# Patient Record
Sex: Female | Born: 1940 | Race: White | Hispanic: No | Marital: Married | Smoking: Former smoker
Health system: Southern US, Community
[De-identification: ages and names within clinical notes are randomized; demographics above are authoritative.]

## PROBLEM LIST (undated history)

## (undated) DIAGNOSIS — E7800 Pure hypercholesterolemia, unspecified: Secondary | ICD-10-CM

## (undated) DIAGNOSIS — I10 Essential (primary) hypertension: Secondary | ICD-10-CM

## (undated) DIAGNOSIS — E119 Type 2 diabetes mellitus without complications: Secondary | ICD-10-CM

## (undated) HISTORY — PX: ORTHOPEDIC SURGERY: SHX850

## (undated) HISTORY — PX: ABDOMINAL HYSTERECTOMY: SHX81

## (undated) HISTORY — PX: CHOLECYSTECTOMY: SHX55

---

## 2009-01-10 ENCOUNTER — Ambulatory Visit: Payer: Self-pay | Admitting: Internal Medicine

## 2010-06-22 ENCOUNTER — Ambulatory Visit: Payer: Self-pay | Admitting: Family Medicine

## 2012-03-05 ENCOUNTER — Ambulatory Visit: Payer: Self-pay | Admitting: Internal Medicine

## 2015-07-12 ENCOUNTER — Other Ambulatory Visit: Payer: Self-pay | Admitting: Family Medicine

## 2015-07-12 DIAGNOSIS — Z1231 Encounter for screening mammogram for malignant neoplasm of breast: Secondary | ICD-10-CM

## 2018-06-20 ENCOUNTER — Ambulatory Visit
Admission: EM | Admit: 2018-06-20 | Discharge: 2018-06-20 | Disposition: A | Discharge status: Home / Self Care | Payer: Medicare Other | Attending: Family Medicine | Admitting: Family Medicine

## 2018-06-20 ENCOUNTER — Other Ambulatory Visit: Payer: Self-pay

## 2018-06-20 DIAGNOSIS — I10 Essential (primary) hypertension: Secondary | ICD-10-CM | POA: Insufficient documentation

## 2018-06-20 DIAGNOSIS — E7800 Pure hypercholesterolemia, unspecified: Secondary | ICD-10-CM | POA: Insufficient documentation

## 2018-06-20 DIAGNOSIS — Z880 Allergy status to penicillin: Secondary | ICD-10-CM | POA: Diagnosis not present

## 2018-06-20 DIAGNOSIS — Z885 Allergy status to narcotic agent status: Secondary | ICD-10-CM | POA: Diagnosis not present

## 2018-06-20 DIAGNOSIS — E119 Type 2 diabetes mellitus without complications: Secondary | ICD-10-CM | POA: Diagnosis not present

## 2018-06-20 DIAGNOSIS — Z7984 Long term (current) use of oral hypoglycemic drugs: Secondary | ICD-10-CM | POA: Insufficient documentation

## 2018-06-20 DIAGNOSIS — R300 Dysuria: Secondary | ICD-10-CM | POA: Diagnosis present

## 2018-06-20 DIAGNOSIS — Z79899 Other long term (current) drug therapy: Secondary | ICD-10-CM | POA: Insufficient documentation

## 2018-06-20 DIAGNOSIS — Z87891 Personal history of nicotine dependence: Secondary | ICD-10-CM | POA: Diagnosis not present

## 2018-06-20 DIAGNOSIS — R109 Unspecified abdominal pain: Secondary | ICD-10-CM | POA: Diagnosis present

## 2018-06-20 DIAGNOSIS — N390 Urinary tract infection, site not specified: Secondary | ICD-10-CM | POA: Diagnosis not present

## 2018-06-20 HISTORY — DX: Type 2 diabetes mellitus without complications: E11.9

## 2018-06-20 HISTORY — DX: Essential (primary) hypertension: I10

## 2018-06-20 HISTORY — DX: Pure hypercholesterolemia, unspecified: E78.00

## 2018-06-20 LAB — URINALYSIS, COMPLETE (UACMP) WITH MICROSCOPIC
BILIRUBIN URINE: NEGATIVE
Glucose, UA: NEGATIVE mg/dL
Hgb urine dipstick: NEGATIVE
KETONES UR: NEGATIVE mg/dL
NITRITE: POSITIVE — AB
PH: 6 (ref 5.0–8.0)
Protein, ur: NEGATIVE mg/dL
RBC / HPF: NONE SEEN RBC/hpf (ref 0–5)
Specific Gravity, Urine: 1.015 (ref 1.005–1.030)
WBC, UA: >50 WBC/hpf (ref 0–5)

## 2018-06-20 MED ORDER — NITROFURANTOIN MONOHYD MACRO 100 MG PO CAPS: 100.0000 mg | ORAL_CAPSULE | Freq: Two times a day (BID) | ORAL | 0 refills | Status: DC

## 2018-06-20 NOTE — ED Triage Notes (Signed)
"  I have a UTI."  Flank pain and dysuria starting yesterday.

## 2018-06-20 NOTE — Discharge Instructions (Signed)
Drink  lots of water

## 2018-06-20 NOTE — ED Provider Notes (Signed)
[facility]    CSN: [id number] Arrival date & time: 06/20/18  0851     History   Chief Complaint Chief Complaint  Patient presents with  . Flank Pain  . Dysuria    HPI Dawn Mccann is a 77 y.o. female.   The history is provided by the patient.  Flank Pain  Pertinent negatives include no abdominal pain.  Dysuria  Pain quality:  Burning and aching Pain severity:  Mild Onset quality:  Sudden Duration:  2 days Timing:  Constant Progression:  Worsening Chronicity:  New Recent urinary tract infections: no   Relieved by:  None tried Ineffective treatments:  None tried Associated symptoms: no abdominal pain, no fever, no flank pain, no nausea, no vaginal discharge and no vomiting   Risk factors: no hx of pyelonephritis, no hx of urolithiasis, no kidney transplant, not pregnant, no recurrent urinary tract infections, no renal cysts, no renal disease, no single kidney and no urinary catheter     Past Medical History:  Diagnosis Date  . Diabetes mellitus without complication ([place])   . High cholesterol   . Hypertension     There are no active problems to display for this patient.   Past Surgical History:  Procedure Laterality Date  . ABDOMINAL HYSTERECTOMY    . CHOLECYSTECTOMY    . ORTHOPEDIC SURGERY      OB History   None      Home Medications    Prior to Admission medications   Medication Sig Start Date End Date Taking? Authorizing Provider  metFORMIN (GLUCOPHAGE-XR) 500 MG 24 hr tablet Take by mouth. 01/11/18 01/11/19 Yes [provider]  lisinopril-hydrochlorothiazide (PRINZIDE,ZESTORETIC) 20-12.5 MG tablet Take 1 tablet by mouth daily. 04/15/18   [provider]  nitrofurantoin, macrocrystal-monohydrate, (MACROBID) 100 MG capsule Take 1 capsule (100 mg total) by mouth 2 (two) times daily. 06/20/18   Norval Gable, MD  rosuvastatin (CRESTOR) 40 MG tablet Take 40 mg by mouth daily. 04/15/18   [provider]    Family  History History reviewed. No pertinent family history.  Social History Social History   Tobacco Use  . Smoking status: Former Research scientist (life sciences)  . Smokeless tobacco: Never Used  Substance Use Topics  . Alcohol use: Never    Frequency: Never  . Drug use: Never     Allergies   Amoxicillin and Codeine   Review of Systems Review of Systems  Constitutional: Negative for fever.  Gastrointestinal: Negative for abdominal pain, nausea and vomiting.  Genitourinary: Positive for dysuria. Negative for flank pain and vaginal discharge.     Physical Exam Triage Vital Signs ED Triage Vitals  Enc Vitals Group     BP 06/20/18 0902 (!) 158/73     Pulse Rate 06/20/18 0902 78     Resp 06/20/18 0902 18     Temp 06/20/18 0902 97.6 F (36.4 C)     Temp Source 06/20/18 0902 Oral     SpO2 06/20/18 0902 100 %     Weight 06/20/18 0903 188 lb (85.3 kg)     Height 06/20/18 0903 5\' 4"  (1.626 m)     Head Circumference --      Peak Flow --      Pain Score 06/20/18 0902 4     Pain Loc --      Pain Edu? --      Excl. in [place]? --    No data found.  Updated Vital Signs BP (!) 158/73 (BP Location: Right Arm)  Pulse 78   Temp 97.6 F (36.4 C) (Oral)   Resp 18   Ht 5\' 4"  (1.626 m)   Wt 188 lb (85.3 kg)   SpO2 100%   BMI 32.27 kg/m   Visual Acuity Right Eye Distance:   Left Eye Distance:   Bilateral Distance:    Right Eye Near:   Left Eye Near:    Bilateral Near:     Physical Exam  Constitutional: She appears well-developed and well-nourished. No distress.  Abdominal: Soft. Bowel sounds are normal. She exhibits no distension and no mass. There is no tenderness. There is no rebound and no guarding.  Skin: She is not diaphoretic.  Nursing note and vitals reviewed.    UC Treatments / Results  Labs (all labs ordered are listed, but only abnormal results are displayed) Labs Reviewed  URINALYSIS, COMPLETE (UACMP) WITH MICROSCOPIC - Abnormal; Notable for the following components:      Result  Value   APPearance CLOUDY (*)    Nitrite POSITIVE (*)    Leukocytes, UA LARGE (*)    Bacteria, UA MANY (*)    All other components within normal limits  URINE CULTURE    EKG None  Radiology No results found.  Procedures Procedures (including critical care time)  Medications Ordered in UC Medications - No data to display  Initial Impression / Assessment and Plan / UC Course  I have reviewed the triage vital signs and the nursing notes.  Pertinent labs & imaging results that were available during my care of the patient were reviewed by me and considered in my medical decision making (see chart for details).      Final Clinical Impressions(s) / UC Diagnoses   Final diagnoses:  Lower urinary tract infectious disease     Discharge Instructions     Drink lots of water    ED Prescriptions    Medication Sig Dispense Auth. Provider   nitrofurantoin, macrocrystal-monohydrate, (MACROBID) 100 MG capsule Take 1 capsule (100 mg total) by mouth 2 (two) times daily. 14 capsule Norval Gable, MD     1. Lab results and diagnosis reviewed with patient 2. rx as per orders above; reviewed possible side effects, interactions, risks and benefits  3. Recommend supportive treatment as above 4. Follow-up prn if symptoms worsen or don't improve  Controlled Substance Prescriptions [place] Controlled Substance Registry consulted? Not Applicable   Norval Gable, MD 06/20/18 [id number]

## 2018-06-23 LAB — URINE CULTURE
Culture: >=100000 — AB
Special Requests: NORMAL

## 2018-06-24 ENCOUNTER — Telehealth (HOSPITAL_COMMUNITY): Payer: Self-pay

## 2018-06-24 MED ORDER — SULFAMETHOXAZOLE-TRIMETHOPRIM 800-160 MG PO TABS: 1.0000 | ORAL_TABLET | Freq: Two times a day (BID) | ORAL | 0 refills | Status: AC

## 2018-06-24 NOTE — Telephone Encounter (Signed)
Urine culture kebsiella pneumoniae. Macrobid given at urgent care will not treat.  Rx for Bactrim BID x 5 days sent to pharmacy of choice. Pt called and made aware.

## 2018-07-20 ENCOUNTER — Inpatient Hospital Stay: Payer: Medicare Other

## 2018-07-20 ENCOUNTER — Inpatient Hospital Stay: Payer: Medicare Other | Attending: Internal Medicine | Admitting: Internal Medicine

## 2018-07-20 VITALS — BP 111/66 | HR 74 | Temp 97.2°F | Resp 16 | Wt 187.0 lb

## 2018-07-20 DIAGNOSIS — R5383 Other fatigue: Secondary | ICD-10-CM | POA: Insufficient documentation

## 2018-07-20 DIAGNOSIS — E119 Type 2 diabetes mellitus without complications: Secondary | ICD-10-CM | POA: Diagnosis not present

## 2018-07-20 DIAGNOSIS — D509 Iron deficiency anemia, unspecified: Secondary | ICD-10-CM | POA: Insufficient documentation

## 2018-07-20 DIAGNOSIS — Z79899 Other long term (current) drug therapy: Secondary | ICD-10-CM | POA: Insufficient documentation

## 2018-07-20 DIAGNOSIS — Z87891 Personal history of nicotine dependence: Secondary | ICD-10-CM | POA: Diagnosis not present

## 2018-07-20 DIAGNOSIS — Z7984 Long term (current) use of oral hypoglycemic drugs: Secondary | ICD-10-CM | POA: Insufficient documentation

## 2018-07-20 DIAGNOSIS — I10 Essential (primary) hypertension: Secondary | ICD-10-CM | POA: Diagnosis not present

## 2018-07-20 DIAGNOSIS — D472 Monoclonal gammopathy: Secondary | ICD-10-CM | POA: Diagnosis not present

## 2018-07-20 DIAGNOSIS — R252 Cramp and spasm: Secondary | ICD-10-CM | POA: Diagnosis not present

## 2018-07-20 DIAGNOSIS — R531 Weakness: Secondary | ICD-10-CM | POA: Diagnosis not present

## 2018-07-20 DIAGNOSIS — E7800 Pure hypercholesterolemia, unspecified: Secondary | ICD-10-CM | POA: Diagnosis not present

## 2018-07-20 LAB — CBC WITH DIFFERENTIAL/PLATELET
Basophils Absolute: 0 10*3/uL (ref 0–0.1)
Basophils Relative: 0 %
EOS ABS: 0.1 10*3/uL (ref 0–0.7)
EOS PCT: 1 %
HCT: 32.4 % — ABNORMAL LOW (ref 35.0–47.0)
Hemoglobin: 11 g/dL — ABNORMAL LOW (ref 12.0–16.0)
LYMPHS ABS: 1.9 10*3/uL (ref 1.0–3.6)
Lymphocytes Relative: 31 %
MCH: 27.4 pg (ref 26.0–34.0)
MCHC: 34 g/dL (ref 32.0–36.0)
MCV: 80.7 fL (ref 80.0–100.0)
Monocytes Absolute: 0.3 10*3/uL (ref 0.2–0.9)
Monocytes Relative: 5 %
Neutro Abs: 3.8 10*3/uL (ref 1.4–6.5)
Neutrophils Relative %: 63 %
PLATELETS: 192 10*3/uL (ref 150–440)
RBC: 4.02 MIL/uL (ref 3.80–5.20)
RDW: 17.9 % — ABNORMAL HIGH (ref 11.5–14.5)
WBC: 6 10*3/uL (ref 3.6–11.0)

## 2018-07-20 LAB — HEPATIC FUNCTION PANEL
ALBUMIN: 4.6 g/dL (ref 3.5–5.0)
ALK PHOS: 64 U/L (ref 38–126)
ALT: 14 U/L (ref 0–44)
AST: 21 U/L (ref 15–41)
BILIRUBIN DIRECT: 0.1 mg/dL (ref 0.0–0.2)
BILIRUBIN TOTAL: 0.7 mg/dL (ref 0.3–1.2)
Indirect Bilirubin: 0.6 mg/dL (ref 0.3–0.9)
Total Protein: 7.4 g/dL (ref 6.5–8.1)

## 2018-07-20 LAB — RETICULOCYTES
RBC.: 3.93 MIL/uL (ref 3.80–5.20)
Retic Count, Absolute: 153.3 10*3/uL (ref 19.0–183.0)
Retic Ct Pct: 3.9 % — ABNORMAL HIGH (ref 0.4–3.1)

## 2018-07-20 LAB — LACTATE DEHYDROGENASE: LDH: 138 U/L (ref 98–192)

## 2018-07-20 LAB — C-REACTIVE PROTEIN: CRP: 1.4 mg/dL — AB (ref <1.0)

## 2018-07-20 NOTE — Assessment & Plan Note (Addendum)
#   Chronic mild anemia with recent worsening hemoglobin around 10; MCV slightly low at 76.  The etiology is unclear.  B12/folic acid-normal; Iron studies/PCP-not consistent with iron deficiency.  The etiology is unclear.  # Recommend work-up including CBC; LFTs myeloma work-up CRP.  Discussed with the patient that if above work-up are unrevealing-CT of the abdomen pelvis would be a reasonable.   Follow to be TBD based on above blood work.  Thank you Dr.Aldridge for allowing me to participate in the care of your pleasant patient. Please do not hesitate to contact me with questions or concerns in the interim.  # 45 minutes face-to-face with the patient discussing the above plan of care; more than 50% of time spent on prognosis/ natural history; counseling and coordination.

## 2018-07-20 NOTE — Progress Notes (Signed)
[name]  Patient Care Team: Gayland Curry, MD as PCP - General (Family Medicine)  CHIEF COMPLAINTS/PURPOSE OF CONSULTATION:  Anemia  # Anemia: [2016]- 10-11 Iron studies- NOT c/w IDA  # Sternal Biopsy [at 37 y]-bloodstream infection/status post hysterectomy.  Colonoscopy at [facility]   No history exists.     HISTORY OF PRESENTING ILLNESS:  Dawn Mccann 77 y.o.  female " lifelong history of anemia" as reported patient has been referred to [place] for further evaluation recommendations for anemia hemoglobin on 10.  Patient states that she has been anemic all her life; and has been fed on liver as a child.   Patient denies any blood in stools or black or stools.  Denies any blood in urine.  No vaginal bleeding.  Patient is not on p.o. iron because of constipation.  Patient does complain of intermittent cramping in the legs.  She has been on diet lost about 20 pounds in the last 12 months.  Intentional.  She does complain of significant fatigue.   Review of Systems  Constitutional: Positive for malaise/fatigue. Negative for chills, diaphoresis, fever and weight loss.  HENT: Negative for nosebleeds and sore throat.   Eyes: Negative for double vision.  Respiratory: Negative for cough, hemoptysis, sputum production, shortness of breath and wheezing.   Cardiovascular: Negative for chest pain, palpitations, orthopnea and leg swelling.  Gastrointestinal: Negative for abdominal pain, blood in stool, constipation, diarrhea, heartburn, melena, nausea and vomiting.  Genitourinary: Negative for dysuria, frequency and urgency.  Musculoskeletal: Negative for back pain and joint pain.  Skin: Negative.  Negative for itching and rash.  Neurological: Negative for dizziness, tingling, focal weakness, weakness and headaches.  Endo/Heme/Allergies: Does not bruise/bleed easily.  Psychiatric/Behavioral: Negative for depression. The patient is not nervous/anxious and does  not have insomnia.      MEDICAL HISTORY:  Past Medical History:  Diagnosis Date  . Diabetes mellitus without complication ([place])   . High cholesterol   . Hypertension     SURGICAL HISTORY: Past Surgical History:  Procedure Laterality Date  . ABDOMINAL HYSTERECTOMY    . CHOLECYSTECTOMY    . ORTHOPEDIC SURGERY      SOCIAL HISTORY: retd. [employer]; quit smoking 10 years [30ppd]; no alcohol; lives in [place] History   Socioeconomic History  . Marital status: Married    Spouse name: Not on file  . Number of children: Not on file  . Years of education: Not on file  . Highest education level: Not on file  Occupational History  . Not on file  Social Needs  . Financial resource strain: Not on file  . Food insecurity:    Worry: Not on file    Inability: Not on file  . Transportation needs:    Medical: Not on file    Non-medical: Not on file  Tobacco Use  . Smoking status: Former Research scientist (life sciences)  . Smokeless tobacco: Never Used  Substance and Sexual Activity  . Alcohol use: Never    Frequency: Never  . Drug use: Never  . Sexual activity: Not on file  Lifestyle  . Physical activity:    Days per week: Not on file    Minutes per session: Not on file  . Stress: Not on file  Relationships  . Social connections:    Talks on phone: Not on file    Gets together: Not on file    Attends religious service: Not on file    Active member of club or organization:  Not on file    Attends meetings of clubs or organizations: Not on file    Relationship status: Not on file  . Intimate partner violence:    Fear of current or ex partner: Not on file    Emotionally abused: Not on file    Physically abused: Not on file    Forced sexual activity: Not on file  Other Topics Concern  . Not on file  Social History Narrative  . Not on file    FAMILY HISTORY: No family history on file.  ALLERGIES:  is allergic to amoxicillin and codeine.  MEDICATIONS:  Current Outpatient Medications   Medication Sig Dispense Refill  . lisinopril-hydrochlorothiazide (PRINZIDE,ZESTORETIC) 20-12.5 MG tablet Take 1 tablet by mouth daily.  3  . metFORMIN (GLUCOPHAGE-XR) 500 MG 24 hr tablet Take by mouth.    . rosuvastatin (CRESTOR) 40 MG tablet Take 40 mg by mouth daily.  3   No current facility-administered medications for this visit.       Marland Kitchen  PHYSICAL EXAMINATION: ECOG PERFORMANCE STATUS: 1 - Symptomatic but completely ambulatory  Vitals:   07/20/18 1403  BP: 111/66  Pulse: 74  Resp: 16  Temp: (!) 97.2 F (36.2 C)   Filed Weights   07/20/18 1401  Weight: 187 lb (84.8 kg)    GENERAL: Well-nourished well-developed; Alert, no distress and comfortable.  She is alone. EYES: no pallor or icterus OROPHARYNX: no thrush or ulceration; NECK: supple; no lymph nodes felt. LYMPH:  no palpable lymphadenopathy in the axillary or inguinal regions LUNGS: Decreased breath sounds auscultation bilaterally. No wheeze or crackles HEART/CVS: regular rate & rhythm and no murmurs; No lower extremity edema ABDOMEN:abdomen soft, non-tender and normal bowel sounds. No hepatomegaly or splenomegaly.  Musculoskeletal:no cyanosis of digits and no clubbing  PSYCH: alert & oriented x 3 with fluent speech NEURO: no focal motor/sensory deficits SKIN:  no rashes or significant lesions   LABORATORY DATA:  I have reviewed the data as listed Lab Results  Component Value Date   WBC 6.0 07/20/2018   HGB 11.0 (L) 07/20/2018   HCT 32.4 (L) 07/20/2018   MCV 80.7 07/20/2018   PLT 192 07/20/2018   Recent Labs    07/20/18 1451  PROT 7.4  ALBUMIN 4.6  AST 21  ALT 14  ALKPHOS 64  BILITOT 0.7  BILIDIR 0.1  IBILI 0.6    RADIOGRAPHIC STUDIES: I have personally reviewed the radiological images as listed and agreed with the findings in the report. No results found.  ASSESSMENT & PLAN:   Microcytic anemia # Chronic mild anemia with recent worsening hemoglobin around 10; MCV slightly low at 76.  The  etiology is unclear.  B12/folic acid-normal; Iron studies/PCP-not consistent with iron deficiency.  The etiology is unclear.  # Recommend work-up including CBC; LFTs myeloma work-up CRP.  Discussed with the patient that if above work-up are unrevealing-CT of the abdomen pelvis would be a reasonable.   Follow to be TBD based on above blood work.  Thank you Dr.Aldridge for allowing me to participate in the care of your pleasant patient. Please do not hesitate to contact me with questions or concerns in the interim.  # 45 minutes face-to-face with the patient discussing the above plan of care; more than 50% of time spent on prognosis/ natural history; counseling and coordination.    All questions were answered. The patient knows to call the clinic with any problems, questions or concerns.     Cammie Sickle, MD 07/22/2018 5:32  PM

## 2018-07-21 LAB — KAPPA/LAMBDA LIGHT CHAINS
Kappa free light chain: 27.7 mg/L — ABNORMAL HIGH (ref 3.3–19.4)
Kappa, lambda light chain ratio: 1 (ref 0.26–1.65)
Lambda free light chains: 27.6 mg/L — ABNORMAL HIGH (ref 5.7–26.3)

## 2018-07-22 ENCOUNTER — Telehealth: Payer: Self-pay | Admitting: Internal Medicine

## 2018-07-22 LAB — MULTIPLE MYELOMA PANEL, SERUM
ALBUMIN SERPL ELPH-MCNC: 4 g/dL (ref 2.9–4.4)
Albumin/Glob SerPl: 1.3 (ref 0.7–1.7)
Alpha 1: 0.3 g/dL (ref 0.0–0.4)
Alpha2 Glob SerPl Elph-Mcnc: 0.7 g/dL (ref 0.4–1.0)
B-Globulin SerPl Elph-Mcnc: 1.1 g/dL (ref 0.7–1.3)
Gamma Glob SerPl Elph-Mcnc: 1.1 g/dL (ref 0.4–1.8)
Globulin, Total: 3.1 g/dL (ref 2.2–3.9)
IGM (IMMUNOGLOBULIN M), SRM: 303 mg/dL — AB (ref 26–217)
IgA: 100 mg/dL (ref 64–422)
IgG (Immunoglobin G), Serum: 918 mg/dL (ref 700–1600)
M Protein SerPl Elph-Mcnc: 0.4 g/dL — ABNORMAL HIGH
Total Protein ELP: 7.1 g/dL (ref 6.0–8.5)

## 2018-07-22 NOTE — Telephone Encounter (Signed)
Dawn Mccann/Brooke-please inform patient that her hemoglobin is stable at 11; however I would like to speak to her regarding the results of the blood work/next plan in person.  I would recommend follow-up with me next week August 13 in [place]; no labs.  Thank you

## 2018-07-23 NOTE — Telephone Encounter (Signed)
I left message for patient to return phone call.   

## 2018-07-26 NOTE — Telephone Encounter (Signed)
I have contacted patient and notified her of these lab results and Dr. Sharmaine Base recommendations.  Shirlean Mylar, would you mind scheduling patient with Dr. B at 11:30 tomorrow? Thanks!

## 2018-07-27 ENCOUNTER — Encounter: Payer: Self-pay | Admitting: Internal Medicine

## 2018-07-27 ENCOUNTER — Inpatient Hospital Stay (HOSPITAL_BASED_OUTPATIENT_CLINIC_OR_DEPARTMENT_OTHER): Payer: Medicare Other | Admitting: Internal Medicine

## 2018-07-27 VITALS — BP 138/74 | HR 86 | Temp 97.1°F | Resp 16 | Wt 186.0 lb

## 2018-07-27 DIAGNOSIS — E7800 Pure hypercholesterolemia, unspecified: Secondary | ICD-10-CM

## 2018-07-27 DIAGNOSIS — D509 Iron deficiency anemia, unspecified: Secondary | ICD-10-CM

## 2018-07-27 DIAGNOSIS — I10 Essential (primary) hypertension: Secondary | ICD-10-CM

## 2018-07-27 DIAGNOSIS — R252 Cramp and spasm: Secondary | ICD-10-CM

## 2018-07-27 DIAGNOSIS — R531 Weakness: Secondary | ICD-10-CM

## 2018-07-27 DIAGNOSIS — D472 Monoclonal gammopathy: Secondary | ICD-10-CM

## 2018-07-27 DIAGNOSIS — R5383 Other fatigue: Secondary | ICD-10-CM | POA: Diagnosis not present

## 2018-07-27 DIAGNOSIS — Z7984 Long term (current) use of oral hypoglycemic drugs: Secondary | ICD-10-CM

## 2018-07-27 DIAGNOSIS — Z79899 Other long term (current) drug therapy: Secondary | ICD-10-CM

## 2018-07-27 DIAGNOSIS — Z87891 Personal history of nicotine dependence: Secondary | ICD-10-CM

## 2018-07-27 DIAGNOSIS — E119 Type 2 diabetes mellitus without complications: Secondary | ICD-10-CM

## 2018-07-27 NOTE — Assessment & Plan Note (Addendum)
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  Normal kappa lambda light chain ratio.  New.   # I had a long discussion the patient regarding natural history and biology of MGUS; in general IgM MGUS is categorized in the high risk.  However at this time surveillance is recommended.  I would not recommend a bone marrow biopsy at this time.  # Chronic mild anemia with recent worsening hemoglobin around 11-recent UTI/CRP- 1.4.   # follow up in 6 months/cbcbmp/myeloma panel/K-L light chains- 1 week- Dr.B  # 25 minutes face-to-face with the patient discussing the above plan of care; more than 50% of time spent on prognosis/ natural history; counseling and coordination.

## 2018-07-27 NOTE — Progress Notes (Signed)
[place] NOTE  Patient Care Team: Gayland Curry, MD as PCP - General (Family Medicine)  CHIEF COMPLAINTS/PURPOSE OF CONSULTATION:  Anemia  # AUGUST 2019-  MGUS-IgM Kappa 0.4gm/dl N=K/l light chain ratio.  # Anemia: [2016]- 10-11 Iron studies- NOT c/w IDA  # Sternal Biopsy [at 37 y]-bloodstream infection/status post hysterectomy.  Colonoscopy at [facility]   No history exists.     HISTORY OF PRESENTING ILLNESS:  Dawn Mccann 77 y.o.  female is here to review the results of her work-up for anemia.  Patient continues to have mild to moderate fatigue.   Complains of intermittent cramping in the legs.  Otherwise no tingling or numbness.  No nausea no vomiting.  Appetite is good.  She has been on diet.   Review of Systems  Constitutional: Positive for malaise/fatigue. Negative for chills, diaphoresis, fever and weight loss.  HENT: Negative for nosebleeds and sore throat.   Eyes: Negative for double vision.  Respiratory: Negative for cough, hemoptysis, sputum production, shortness of breath and wheezing.   Cardiovascular: Negative for chest pain, palpitations, orthopnea and leg swelling.  Gastrointestinal: Negative for abdominal pain, blood in stool, constipation, diarrhea, heartburn, melena, nausea and vomiting.  Genitourinary: Negative for dysuria, frequency and urgency.  Musculoskeletal: Negative for back pain and joint pain.  Skin: Negative.  Negative for itching and rash.  Neurological: Negative for dizziness, tingling, focal weakness, weakness and headaches.  Endo/Heme/Allergies: Does not bruise/bleed easily.  Psychiatric/Behavioral: Negative for depression. The patient is not nervous/anxious and does not have insomnia.      MEDICAL HISTORY:  Past Medical History:  Diagnosis Date  . Diabetes mellitus without complication ([place])   . High cholesterol   . Hypertension     SURGICAL HISTORY: Past Surgical History:  Procedure Laterality Date   . ABDOMINAL HYSTERECTOMY    . CHOLECYSTECTOMY    . ORTHOPEDIC SURGERY      SOCIAL HISTORY: retd. [employer]; quit smoking 10 years [30ppd]; no alcohol; lives in [place] History   Socioeconomic History  . Marital status: Married    Spouse name: Not on file  . Number of children: Not on file  . Years of education: Not on file  . Highest education level: Not on file  Occupational History  . Not on file  Social Needs  . Financial resource strain: Not on file  . Food insecurity:    Worry: Not on file    Inability: Not on file  . Transportation needs:    Medical: Not on file    Non-medical: Not on file  Tobacco Use  . Smoking status: Former Research scientist (life sciences)  . Smokeless tobacco: Never Used  Substance and Sexual Activity  . Alcohol use: Never    Frequency: Never  . Drug use: Never  . Sexual activity: Not on file  Lifestyle  . Physical activity:    Days per week: Not on file    Minutes per session: Not on file  . Stress: Not on file  Relationships  . Social connections:    Talks on phone: Not on file    Gets together: Not on file    Attends religious service: Not on file    Active member of club or organization: Not on file    Attends meetings of clubs or organizations: Not on file    Relationship status: Not on file  . Intimate partner violence:    Fear of current or ex partner: Not on file    Emotionally abused: Not  on file    Physically abused: Not on file    Forced sexual activity: Not on file  Other Topics Concern  . Not on file  Social History Narrative  . Not on file    FAMILY HISTORY: History reviewed. No pertinent family history.  ALLERGIES:  is allergic to amoxicillin and codeine.  MEDICATIONS:  Current Outpatient Medications  Medication Sig Dispense Refill  . lisinopril-hydrochlorothiazide (PRINZIDE,ZESTORETIC) 20-12.5 MG tablet Take 1 tablet by mouth daily.  3  . metFORMIN (GLUCOPHAGE-XR) 500 MG 24 hr tablet Take by mouth.    . rosuvastatin (CRESTOR) 40 MG  tablet Take 40 mg by mouth daily.  3   No current facility-administered medications for this visit.       Marland Kitchen  PHYSICAL EXAMINATION: ECOG PERFORMANCE STATUS: 1 - Symptomatic but completely ambulatory  Vitals:   07/27/18 1113 07/27/18 1114  BP:  138/74  Pulse:  86  Resp: 16 16  Temp:  (!) 97.1 F (36.2 C)   Filed Weights   07/27/18 1113  Weight: 186 lb (84.4 kg)    GENERAL: Well-nourished well-developed; Alert, no distress and comfortable.  She is alone. EYES: no pallor or icterus OROPHARYNX: no thrush or ulceration; NECK: supple; no lymph nodes felt. LYMPH:  no palpable lymphadenopathy in the axillary or inguinal regions LUNGS: Decreased breath sounds auscultation bilaterally. No wheeze or crackles HEART/CVS: regular rate & rhythm and no murmurs; No lower extremity edema ABDOMEN:abdomen soft, non-tender and normal bowel sounds. No hepatomegaly or splenomegaly.  Musculoskeletal:no cyanosis of digits and no clubbing  PSYCH: alert & oriented x 3 with fluent speech NEURO: no focal motor/sensory deficits SKIN:  no rashes or significant lesions   LABORATORY DATA:  I have reviewed the data as listed Lab Results  Component Value Date   WBC 6.0 07/20/2018   HGB 11.0 (L) 07/20/2018   HCT 32.4 (L) 07/20/2018   MCV 80.7 07/20/2018   PLT 192 07/20/2018   Recent Labs    07/20/18 1451  PROT 7.4  ALBUMIN 4.6  AST 21  ALT 14  ALKPHOS 64  BILITOT 0.7  BILIDIR 0.1  IBILI 0.6    RADIOGRAPHIC STUDIES: I have personally reviewed the radiological images as listed and agreed with the findings in the report. No results found.  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  Normal kappa lambda light chain ratio.  New.   # I had a long discussion the patient regarding natural history and biology of MGUS; in general IgM MGUS is categorized in the high risk.  However at  this time surveillance is recommended.  I would not recommend a bone marrow biopsy at this time.  # Chronic mild anemia with recent worsening hemoglobin around 11-recent UTI/CRP- 1.4.   # follow up in 6 months/cbcbmp/myeloma panel/K-L light chains- 1 week- Dr.B  # 25 minutes face-to-face with the patient discussing the above plan of care; more than 50% of time spent on prognosis/ natural history; counseling and coordination.   All questions were answered. The patient knows to call the clinic with any problems, questions or concerns.     Cammie Sickle, MD 08/03/2018 6:19 PM

## 2019-01-14 ENCOUNTER — Other Ambulatory Visit: Payer: Self-pay

## 2019-01-14 ENCOUNTER — Other Ambulatory Visit: Payer: Self-pay | Admitting: *Deleted

## 2019-01-14 DIAGNOSIS — D472 Monoclonal gammopathy: Secondary | ICD-10-CM

## 2019-01-19 ENCOUNTER — Inpatient Hospital Stay: Payer: Medicare Other | Attending: Hematology and Oncology

## 2019-01-19 DIAGNOSIS — Z79899 Other long term (current) drug therapy: Secondary | ICD-10-CM | POA: Diagnosis not present

## 2019-01-19 DIAGNOSIS — D509 Iron deficiency anemia, unspecified: Secondary | ICD-10-CM | POA: Insufficient documentation

## 2019-01-19 DIAGNOSIS — D472 Monoclonal gammopathy: Secondary | ICD-10-CM | POA: Diagnosis not present

## 2019-01-19 LAB — COMPREHENSIVE METABOLIC PANEL
ALBUMIN: 4.2 g/dL (ref 3.5–5.0)
ALT: 15 U/L (ref 0–44)
ANION GAP: 8 (ref 5–15)
AST: 17 U/L (ref 15–41)
Alkaline Phosphatase: 62 U/L (ref 38–126)
BILIRUBIN TOTAL: 0.8 mg/dL (ref 0.3–1.2)
BUN: 13 mg/dL (ref 8–23)
CHLORIDE: 106 mmol/L (ref 98–111)
CO2: 23 mmol/L (ref 22–32)
Calcium: 9.2 mg/dL (ref 8.9–10.3)
Creatinine, Ser: 0.75 mg/dL (ref 0.44–1.00)
GFR calc Af Amer: >60 mL/min (ref >60)
GFR calc non Af Amer: >60 mL/min (ref >60)
GLUCOSE: 139 mg/dL — AB (ref 70–99)
POTASSIUM: 4.2 mmol/L (ref 3.5–5.1)
SODIUM: 137 mmol/L (ref 135–145)
TOTAL PROTEIN: 7.2 g/dL (ref 6.5–8.1)

## 2019-01-19 LAB — CBC WITH DIFFERENTIAL/PLATELET
ABS IMMATURE GRANULOCYTES: 0.01 10*3/uL (ref 0.00–0.07)
BASOS PCT: 0 %
Basophils Absolute: 0 10*3/uL (ref 0.0–0.1)
Eosinophils Absolute: 0.1 10*3/uL (ref 0.0–0.5)
Eosinophils Relative: 2 %
HCT: 34.5 % — ABNORMAL LOW (ref 36.0–46.0)
HEMOGLOBIN: 11.5 g/dL — AB (ref 12.0–15.0)
IMMATURE GRANULOCYTES: 0 %
LYMPHS PCT: 34 %
Lymphs Abs: 1.6 10*3/uL (ref 0.7–4.0)
MCH: 27.3 pg (ref 26.0–34.0)
MCHC: 33.3 g/dL (ref 30.0–36.0)
MCV: 81.9 fL (ref 80.0–100.0)
MONO ABS: 0.3 10*3/uL (ref 0.1–1.0)
MONOS PCT: 7 %
NEUTROS ABS: 2.7 10*3/uL (ref 1.7–7.7)
NEUTROS PCT: 57 %
PLATELETS: 150 10*3/uL (ref 150–400)
RBC: 4.21 MIL/uL (ref 3.87–5.11)
RDW: 14.9 % (ref 11.5–15.5)
WBC: 4.8 10*3/uL (ref 4.0–10.5)
nRBC: 0 % (ref 0.0–0.2)

## 2019-01-20 LAB — KAPPA/LAMBDA LIGHT CHAINS
KAPPA, LAMDA LIGHT CHAIN RATIO: 1.1 (ref 0.26–1.65)
Kappa free light chain: 26.9 mg/L — ABNORMAL HIGH (ref 3.3–19.4)
Lambda free light chains: 24.5 mg/L (ref 5.7–26.3)

## 2019-01-22 LAB — MULTIPLE MYELOMA PANEL, SERUM
ALBUMIN SERPL ELPH-MCNC: 3.9 g/dL (ref 2.9–4.4)
ALPHA 1: 0.3 g/dL (ref 0.0–0.4)
ALPHA2 GLOB SERPL ELPH-MCNC: 0.6 g/dL (ref 0.4–1.0)
Albumin/Glob SerPl: 1.7 (ref 0.7–1.7)
B-Globulin SerPl Elph-Mcnc: 0.7 g/dL (ref 0.7–1.3)
Gamma Glob SerPl Elph-Mcnc: 0.8 g/dL (ref 0.4–1.8)
Globulin, Total: 2.4 g/dL (ref 2.2–3.9)
IGA: 87 mg/dL (ref 64–422)
IGG (IMMUNOGLOBIN G), SERUM: 785 mg/dL (ref 700–1600)
IGM (IMMUNOGLOBULIN M), SRM: 296 mg/dL — AB (ref 26–217)
M Protein SerPl Elph-Mcnc: 0.3 g/dL — ABNORMAL HIGH
Total Protein ELP: 6.3 g/dL (ref 6.0–8.5)

## 2019-01-25 ENCOUNTER — Inpatient Hospital Stay (HOSPITAL_BASED_OUTPATIENT_CLINIC_OR_DEPARTMENT_OTHER): Payer: Medicare Other | Admitting: Hematology and Oncology

## 2019-01-25 ENCOUNTER — Ambulatory Visit: Payer: Medicare Other | Admitting: Internal Medicine

## 2019-01-25 ENCOUNTER — Encounter: Payer: Self-pay | Admitting: Hematology and Oncology

## 2019-01-25 VITALS — BP 143/80 | HR 75 | Temp 98.7°F | Resp 18 | Ht 64.0 in | Wt 189.0 lb

## 2019-01-25 DIAGNOSIS — Z79899 Other long term (current) drug therapy: Secondary | ICD-10-CM | POA: Diagnosis not present

## 2019-01-25 DIAGNOSIS — D509 Iron deficiency anemia, unspecified: Secondary | ICD-10-CM | POA: Diagnosis present

## 2019-01-25 DIAGNOSIS — D649 Anemia, unspecified: Secondary | ICD-10-CM | POA: Insufficient documentation

## 2019-01-25 DIAGNOSIS — D472 Monoclonal gammopathy: Secondary | ICD-10-CM

## 2019-01-25 NOTE — Progress Notes (Signed)
No new changes noted today 

## 2019-01-25 NOTE — Patient Instructions (Signed)
Monoclonal Gammopathy of Undetermined Significance (MGUS)  Monoclonal gammopathy of undetermined significance (MGUS) is a condition in which there is too much of a protein called monoclonal protein, or M protein, in the blood. MGUS can cause you to have too many cells in your blood and not enough space for healthy cells. This condition may increase your risk of developing multiple myeloma or other blood disorders in the future.  What are the causes?  The cause of this condition is not known.  What increases the risk?  You are more likely to develop this condition if:   You are African American.   You are age 50 or older.   You are female.   You have an autoimmune disease.   You have been exposed to radiation.   You have a family history of MGUS.  What are the signs or symptoms?  There are no symptoms of this condition.  How is this diagnosed?    This condition may be diagnosed with a blood test that checks for M protein.  How is this treated?  Treatment for this condition may involve:   Having regular exams. This will allow your health care provider to monitor your health.   Having tests done regularly, such as:  ? Blood tests to check for M protein in your body.  ? Imaging tests, such as a CT scan.  ? A bone marrow biopsy. This test involves taking a sample of bone marrow from your body so it can be looked at under a microscope.  Follow these instructions at home:   Keep all follow-up visits as told by your health care provider. This is important.  Contact a health care provider if:   You have trouble swallowing.   You have pain in your back or ribs.   You have a fever.   You are bruising easily.  Get help right away if:   You break a bone.   You have trouble breathing.  Summary   Monoclonal gammopathy of undetermined significance (MGUS) is a condition in which there is too much of a protein called monoclonal protein, or M protein, in the blood.   This condition may be diagnosed with a blood test  that checks for M protein.   Treatment for this condition may involve having tests done regularly. Tests may include blood tests, imaging tests, and a bone marrow biopsy.  This information is not intended to replace advice given to you by your health care provider. Make sure you discuss any questions you have with your health care provider.  Document Released: 10/22/2016 Document Revised: 10/22/2016 Document Reviewed: 10/22/2016  [employer]vier Interactive Patient Education  2019 [employer]

## 2019-01-25 NOTE — Progress Notes (Signed)
[facility] day:  01/25/2019  Chief Complaint: Dawn Mccann is a 78 y.o. female with an IgM monoclonal gammopathy of unknown significance who is seen for new patient assessment.  HPI:  The patient was initially seen by Dr. Tish Men on 07/20/2018 for anemia.  Hemoglobin was 10.  She noted a lifelong history of anemia.  She describes being fed liver as a child.  She denied any blood in stools or black or stools.  She denies any hematuria or vaginal bleeding.  She was not on oral iron because of constipation.  On initial assessment, she noted intermittent leg cramping.  She was on a diet and had lost about 20 pounds intentionally in12 months.  She had significant fatigue.  Labs on 07/08/2018 revealed a ferritin 178, iron saturation of 14%, and a TIBC 348.  B12 was 324 and folate 12.2.  TSH was 1.44 on 12/28/2014.  Labs on 07/20/2018 revealed a hematocrit of 32.4, hemoglobin 11.0, MCV 80.7, platelets 192,000, WBC 6000 with an ANC of 3800.  Retic was 3.9%.  SPEP revealed a 0.4 gm/dL IgM monoclonal protein with kappa light chain specificity.  IgM was 303 (26-217).  Kappa free light chains were 27.7, lambda free light chains were 27.6 with a ratio of 1.0 (normal).  LFTs were normal.  CRP was 1.4.  LDH was 138.  The patient was last seen in the hematology clinic by Dr. Rogue Bussing on 07/27/2018.  At that time, she noted mild to moderate fatigue.   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  Surveillance was recommended.  Bone marrow biopsy was not recommended. Follow up in 6 months was recommended.  Labs on 01/19/2019 revealed a hematocrit of 34.5, hemoglobin 11.5, MCV 81.9, platelets 150,000, WBC 4800 with an ANC of 2700.  M-spike was 0.3 gm/dL.  IgM was 296.  Kappa free light chains were 26.9, lambda free light chains were 24.5 with a ratio of 1.10 (normal).  Creatinine was 0.75.  LFTs were normal.  Symptomatically, she  is doing well overall.  Previously noted lower extremity cramping has resolved. She has not had leg cramping in over 2 to 3 months. Patient denies that she has experienced any B symptoms. She denies any interval infections. Patient denies bleeding; no hematochezia, melena, or gross hematuria.  Last colonoscopy was on 04/17/2017 at [facility].  She denies chest pain, shortness of breath, and palpitations.   Patient denies urinary symptoms other than frequency. Of note, patient drinks lots of black coffee, to which she attributes the frequency.   Patient advises that she maintains an adequate appetite. She is eating well. Weight today is 189 lb 0.7 oz (85.7 kg). Patient maintains a diet rich in iron. She indicates that she eats meat and green leafy vegetables on a consistent basis. She denies any ice pica or restless legs.    Father has a history of metastatic SCLC (diagnosed at age of 85). Sister had a history of recurrent breast cancer.   She denies pain in the clinic today.   Past Medical History:  Diagnosis Date  . Diabetes mellitus without complication ([place])   . High cholesterol   . Hypertension     Past Surgical History:  Procedure Laterality Date  . ABDOMINAL HYSTERECTOMY    . CHOLECYSTECTOMY    . ORTHOPEDIC SURGERY      History reviewed. No pertinent family history.  Social History:  reports that she has quit smoking. She  has never used smokeless tobacco. She reports that she does not drink alcohol or use drugs.  She does no drink alcohol. Former smoker; quit 2011. Patient is a Engineer, agricultural brat".  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  She lived in [place] as a child.  She lives in [place].  The patient is accompanied by her daughter today.  Allergies:  Allergies  Allergen Reactions  . Amoxicillin Swelling    And HIVES  . Codeine Other (See Comments)    Other Reaction: Mental status changes    Current Medications: Current  Outpatient Medications  Medication Sig Dispense Refill  . lisinopril-hydrochlorothiazide (PRINZIDE,ZESTORETIC) 20-12.5 MG tablet Take 1 tablet by mouth daily.  3  . metFORMIN (GLUCOPHAGE-XR) 500 MG 24 hr tablet Take 500 mg by mouth 4 (four) times daily.     . rosuvastatin (CRESTOR) 40 MG tablet Take 40 mg by mouth daily.  3   No current facility-administered medications for this visit.     Review of Systems:  GENERAL:  Feels good.  No fevers, sweats.  Intentional weight loss (20 pounds in 12 months). PERFORMANCE STATUS (ECOG):  0 HEENT:  Allergies (sneezing).  No visual changes, runny nose, sore throat, mouth sores or tenderness. Lungs: No shortness of breath or cough.  No hemoptysis. Cardiac:  No chest pain, palpitations, orthopnea, or PND. GI:  No nausea, vomiting, diarrhea, constipation, melena or hematochezia.  Last colonoscopy 04/2017. GU:  Urinary frequency from drinking coffee.  No urgency, frequency, dysuria, or hematuria. Musculoskeletal:  No back pain.  No joint pain.  No muscle tenderness. Extremities:  Lower extremity cramping, resolved. Skin:  No rashes or skin changes. Neuro:  No headache, numbness or weakness, balance or coordination issues. Endocrine:  Diabetes.  No thyroid issues, hot flashes or night sweats. Psych:  No mood changes, depression or anxiety. Pain:  No focal pain. Review of systems:  All other systems reviewed and found to be negative.  Physical Exam: Blood pressure (!) 143/80, pulse 75, temperature 98.7 F (37.1 C), temperature source Tympanic, resp. rate 18, height _0  (1.626 m), weight 189 lb 0.7 oz (85.7 kg), SpO2 100 %. GENERAL:  Well developed, well nourished, woman sitting comfortably in the exam room in no acute distress. MENTAL STATUS:  Alert and oriented to person, place and time. HEAD:  Dark hair.  Normocephalic, atraumatic, face symmetric, no Cushingoid features. EYES:  Brown eyes.  Pupils equal round and reactive to light and accomodation.   No conjunctivitis or scleral icterus. ENT:  Oropharynx clear without lesion.  Tongue normal. Mucous membranes moist.  RESPIRATORY:  Clear to auscultation without rales, wheezes or rhonchi. CARDIOVASCULAR:  Regular rate and rhythm without murmur, rub or gallop. ABDOMEN:  Soft, non-tender, with active bowel sounds, and no hepatosplenomegaly.  No masses. SKIN:  No rashes, ulcers or lesions. EXTREMITIES: No edema, no skin discoloration or tenderness.  No palpable cords. LYMPH NODES: No palpable cervical, supraclavicular, axillary or inguinal adenopathy  NEUROLOGICAL: Unremarkable. PSYCH:  Appropriate.   No visits with results within 3 Day(s) from this visit.  Latest known visit with results is:  Appointment on 01/19/2019  Component Date Value Ref Range Status  . Kappa free light chain 01/19/2019 26.9* 3.3 - 19.4 mg/L Final  . Lamda free light chains 01/19/2019 24.5  5.7 - 26.3 mg/L Final  . Kappa, lamda light chain ratio 01/19/2019 1.10  0.26 - 1.65 Final   Comment: (NOTE) Performed At: [facility]  [place], [place] [id number] Rush Farmer MD [id number]   . IgG (Immunoglobin G), Serum 01/19/2019 785  700 - 1,600 mg/dL Final  . IgA 01/19/2019 87  64 - 422 mg/dL Final  . IgM (Immunoglobulin M), Srm 01/19/2019 296* 26 - 217 mg/dL Final  . Total Protein ELP 01/19/2019 6.3  6.0 - 8.5 g/dL Corrected  . Albumin SerPl Elph-Mcnc 01/19/2019 3.9  2.9 - 4.4 g/dL Corrected  . Alpha 1 01/19/2019 0.3  0.0 - 0.4 g/dL Corrected  . Alpha2 Glob SerPl Elph-Mcnc 01/19/2019 0.6  0.4 - 1.0 g/dL Corrected  . B-Globulin SerPl Elph-Mcnc 01/19/2019 0.7  0.7 - 1.3 g/dL Corrected  . Gamma Glob SerPl Elph-Mcnc 01/19/2019 0.8  0.4 - 1.8 g/dL Corrected  . M Protein SerPl Elph-Mcnc 01/19/2019 0.3* Not Observed g/dL Corrected  . Globulin, Total 01/19/2019 2.4  2.2 - 3.9 g/dL Corrected  . Albumin/Glob SerPl 01/19/2019 1.7  0.7 - 1.7 Corrected  . IFE 1 01/19/2019 Comment   Corrected    Comment: (NOTE) Immunofixation shows IgM monoclonal protein with kappa light chain specificity.   . Please Note 01/19/2019 Comment   Corrected   Comment: (NOTE) Protein electrophoresis scan will follow via computer, mail, or courier delivery. Performed At: [facility] [place], [place] [id number] Rush Farmer MD [id number]   . Sodium 01/19/2019 137  135 - 145 mmol/L Final  . Potassium 01/19/2019 4.2  3.5 - 5.1 mmol/L Final  . Chloride 01/19/2019 106  98 - 111 mmol/L Final  . CO2 01/19/2019 23  22 - 32 mmol/L Final  . Glucose, Bld 01/19/2019 139* 70 - 99 mg/dL Final  . BUN 01/19/2019 13  8 - 23 mg/dL Final  . Creatinine, Ser 01/19/2019 0.75  0.44 - 1.00 mg/dL Final  . Calcium 01/19/2019 9.2  8.9 - 10.3 mg/dL Final  . Total Protein 01/19/2019 7.2  6.5 - 8.1 g/dL Final  . Albumin 01/19/2019 4.2  3.5 - 5.0 g/dL Final  . AST 01/19/2019 17  15 - 41 U/L Final  . ALT 01/19/2019 15  0 - 44 U/L Final  . Alkaline Phosphatase 01/19/2019 62  38 - 126 U/L Final  . Total Bilirubin 01/19/2019 0.8  0.3 - 1.2 mg/dL Final  . GFR calc non Af Amer 01/19/2019 >60  >60 mL/min Final  . GFR calc Af Amer 01/19/2019 >60  >60 mL/min Final  . Anion gap 01/19/2019 8  5 - 15 Final   Performed at [facility], [address], [place], [place] [address]  . WBC 01/19/2019 4.8  4.0 - 10.5 K/uL Final  . RBC 01/19/2019 4.21  3.87 - 5.11 MIL/uL Final  . Hemoglobin 01/19/2019 11.5* 12.0 - 15.0 g/dL Final  . HCT 01/19/2019 34.5* 36.0 - 46.0 % Final  . MCV 01/19/2019 81.9  80.0 - 100.0 fL Final  . MCH 01/19/2019 27.3  26.0 - 34.0 pg Final  . MCHC 01/19/2019 33.3  30.0 - 36.0 g/dL Final  . RDW 01/19/2019 14.9  11.5 - 15.5 % Final  . Platelets 01/19/2019 150  150 - 400 K/uL Final  . nRBC 01/19/2019 0.0  0.0 - 0.2 % Final  . Neutrophils Relative % 01/19/2019 57  % Final  . Neutro Abs 01/19/2019 2.7  1.7 - 7.7 K/uL Final  . Lymphocytes Relative 01/19/2019 34  % Final  . Lymphs  Abs 01/19/2019 1.6  0.7 - 4.0 K/uL Final  . Monocytes Relative 01/19/2019 7  % Final  .  Monocytes Absolute 01/19/2019 0.3  0.1 - 1.0 K/uL Final  . Eosinophils Relative 01/19/2019 2  % Final  . Eosinophils Absolute 01/19/2019 0.1  0.0 - 0.5 K/uL Final  . Basophils Relative 01/19/2019 0  % Final  . Basophils Absolute 01/19/2019 0.0  0.0 - 0.1 K/uL Final  . Immature Granulocytes 01/19/2019 0  % Final  . Abs Immature Granulocytes 01/19/2019 0.01  0.00 - 0.07 K/uL Final   Performed at [facility], [address], [place], [place] [address]    Assessment:  JAKITA DUTKIEWICZ is a 78 y.o. female with an IgM monoclonal gammopathy of unknown significance.  SPEP has been followed (gm/dL):  0.4 on 07/20/2018 and 0.3 on 01/19/2019.  IgM has been followed (26-217):  303 on 07/20/2018 and 296 on 01/19/2019.    She has a mild normocytic anemia.  Hemoglobin is 11.5.  Symptomatically, she denies any fever, sweats or weight loss.  Exam reveals no adenopathy or hepatosplenomegaly.  Plan: 1.   Review labs from 01/19/2019. 2.   IgM monoclonal gammopathy of unknown significance  Discuss entire medical history, diagnosis and management of an IgM monoclonal gammopathy.  Discuss risk of transformation to a lymphoproliferative disorder of 1%/year.   Discuss IgM multiple myeloma (extremely rare; < 1% of myeloma).    CRAB criteria:  hypercalcemia, renal insufficiency, anemia, bone lesions.   Discuss briefly Waldenstrom's macroglobulinemia.    organomegaly, bulky adenopathy, anemia/cytopenias related to disease, hyperviscosity, neuropathy.  Discuss low level of monoclonal IgM.  Discuss ongoing surveillance every 6 months. 3.   Mild normocytic anemia  Hematocrit 32.4 - 34.5 and hemoglobin 11.0 - 11.5 since 07/2018.  Discuss work-up at next lab draw.  Patient notes colonoscopy at [facility] in 04/2017.   She is scheduled for follow-up colonoscopy in 2 years secondary to polyps. 4.   RTC in 6 months for  MD assessment and labs (CBC with diff, CMP, SPEP, FLCA, B12, folate, ferritin, iron studies).   Honor Loh, NP  01/25/2019, 10:48 AM   I saw and evaluated the patient, participating in the key portions of the service and reviewing pertinent diagnostic studies and records.  I reviewed the nurse practitioner's note and agree with the findings and the plan.  The assessment and plan were discussed with the patient.  Multiple questions were asked by the patient and answered.   Nolon Stalls, MD 01/25/2019,10:48 AM

## 2019-07-19 ENCOUNTER — Other Ambulatory Visit: Payer: Medicare Other

## 2019-07-26 ENCOUNTER — Ambulatory Visit: Payer: Medicare Other | Admitting: Hematology and Oncology

## 2019-09-27 ENCOUNTER — Other Ambulatory Visit: Payer: Medicare Other

## 2019-10-04 ENCOUNTER — Ambulatory Visit: Payer: Medicare Other | Admitting: Hematology and Oncology

## 2020-04-09 ENCOUNTER — Telehealth: Payer: Self-pay | Admitting: *Deleted

## 2020-04-09 NOTE — Telephone Encounter (Signed)
Received referral for low dose lung cancer screening CT scan. Message left at phone number listed in EMR for patient to call me back to facilitate scheduling scan.  

## 2020-04-10 ENCOUNTER — Encounter: Payer: Self-pay | Admitting: *Deleted

## 2020-04-10 ENCOUNTER — Telehealth: Payer: Self-pay | Admitting: *Deleted

## 2020-04-10 DIAGNOSIS — Z87891 Personal history of nicotine dependence: Secondary | ICD-10-CM

## 2020-04-10 NOTE — Telephone Encounter (Signed)
Received referral for initial lung cancer screening scan. Contacted patient and obtained smoking history,(former, quit 2011, 33.75 pack year) as well as answering questions related to screening process. Patient denies signs of lung cancer such as weight loss or hemoptysis. Patient denies comorbidity that would prevent curative treatment if lung cancer were found. Patient is scheduled for shared decision making visit and CT scan on 04/17/20 at 145pm.

## 2020-04-17 ENCOUNTER — Ambulatory Visit
Admission: RE | Admit: 2020-04-17 | Discharge: 2020-04-17 | Disposition: A | Discharge status: Home / Self Care | Payer: Medicare Other | Source: Ambulatory Visit | Attending: Oncology | Admitting: Oncology

## 2020-04-17 ENCOUNTER — Inpatient Hospital Stay: Payer: Medicare Other | Attending: Oncology | Admitting: Hospice and Palliative Medicine

## 2020-04-17 ENCOUNTER — Other Ambulatory Visit: Payer: Self-pay

## 2020-04-17 DIAGNOSIS — Z87891 Personal history of nicotine dependence: Secondary | ICD-10-CM | POA: Diagnosis not present

## 2020-04-17 DIAGNOSIS — Z122 Encounter for screening for malignant neoplasm of respiratory organs: Secondary | ICD-10-CM | POA: Diagnosis not present

## 2020-04-17 NOTE — Progress Notes (Signed)
In accordance with CMS guidelines, patient has met eligibility criteria including age, absence of signs or symptoms of lung cancer.  Social History   Tobacco Use  . Smoking status: Former Smoker    Packs/day: 0.75    Years: 45.00    Pack years: 33.75    Types: Cigarettes    Quit date: 2011    Years since quitting: 10.3  . Smokeless tobacco: Never Used  Substance Use Topics  . Alcohol use: Never  . Drug use: Never      A shared decision-making session was conducted prior to the performance of CT scan. This includes one or more decision aids, includes benefits and harms of screening, follow-up diagnostic testing, over-diagnosis, false positive rate, and total radiation exposure.   Counseling on the importance of adherence to annual lung cancer LDCT screening, impact of co-morbidities, and ability or willingness to undergo diagnosis and treatment is imperative for compliance of the program.   Counseling on the importance of continued smoking cessation for former smokers; the importance of smoking cessation for current smokers, and information about tobacco cessation interventions have been given to patient including [place] [facility] and 1800 quit [place] programs.   Written order for lung cancer screening with LDCT has been given to the patient and any and all questions have been answered to the best of my abilities.    Yearly follow up will be coordinated by Shawn Perkins, Thoracic Navigator.  Time Total: 15 minutes  Visit consisted of counseling and education dealing with complex health screening. Greater than 50%  of this time was spent counseling and coordinating care related to the above assessment and plan.  Signed by: Joshua Borders, PhD, NP-C [phone] (Work Cell)  

## 2020-04-19 ENCOUNTER — Encounter: Payer: Self-pay | Admitting: *Deleted

## 2020-05-20 NOTE — Progress Notes (Signed)
[facility]  [address], [address] [place], [place] [address] Phone: [phone]  Fax: [phone]   Clinic Day:  05/21/2020  Referring physician: Gayland Curry, MD  Chief Complaint: Dawn Mccann is a 79 y.o. female with with an IgM monoclonal gammopathy of unknown significance who is seen for 16 month assessment.  HPI: The patient was last seen in the hematology clinic on 01/25/2019. At that time, she denied any fever, sweats or weight loss.  Exam revealed no adenopathy or hepatosplenomegaly.  SPEP revealed an M-spike of 0.3 gm/dL.  IgM was 296.  Surveillance continued.    She was lost to follow-up.  She was referred to the lung cancer screening program.  She has a 33.75 pack year smoking history.  She quit smoking in 2011.  Low dose chest CT on 04/17/2020 revealed lung-RADS 2 S.  She has multiple small pulmonary nodules throughout the lungs bilaterally (largest in the right middle lobe measuring 3.7 mm).  She has aortic atherosclerosis and a three-vessel coronary artery disease.  There was mild diffuse bronchial wall thickening with mild central lobar and paraseptal emphysema suggesting underlying COPD.  She has splenomegaly (15.2 x 6.9 cm).  The spleen was incompletely imaged.  She was seen by Orville Govern, NP at [facility] on 05/01/2020 for multiple thyroid nodules.  She was noted to have thyroid nodules found incidentally on a carotid ultrasound.  Ultrasound on 05/01/2020 revealed no suspicious nodules on the right side and an intermediate suspicious 2.8 cm left-sided nodule.  She underwent fine-needle aspirate on 05/01/2020.  Cytology was negative for malignancy.  There was abundant colloid consistent with a benign colloid nodule.  During the interim, she has been well. She has been spending the last month catching up on all of her missed appointments from COVID-19. She is fully COVID-19 vaccinated, and received her 2nd vaccine on 01/12/2020; she  denied difficulty with the vaccine.   She has not gotten her repeat colonoscopy yet. She notes that it is not at the top of her priority list right now, but that she will not forget about it or put it off for too long.    Past Medical History:  Diagnosis Date  . Diabetes mellitus without complication ([place])   . High cholesterol   . Hypertension     Past Surgical History:  Procedure Laterality Date  . ABDOMINAL HYSTERECTOMY    . CHOLECYSTECTOMY    . ORTHOPEDIC SURGERY      History reviewed. No pertinent family history.  Social History:  reports that she quit smoking about 10 years ago. Her smoking use included cigarettes. She has a 33.75 pack-year smoking history. She has never used smokeless tobacco. She reports that she does not drink alcohol or use drugs. She does not drink alcohol. Former smoker; quit 2011. Patient is a Engineer, agricultural brat".  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  She lived in [place] as a child.  She lives in [place]. The patient is accompanied by her daughter, Cammy Copa, via ipad today.  Allergies:  Allergies  Allergen Reactions  . Amoxicillin Swelling    And HIVES  . Codeine Other (See Comments)    Other Reaction: Mental status changes    Current Medications: Current Outpatient Medications  Medication Sig Dispense Refill  . aspirin 81 MG EC tablet Take by mouth.    . Cholecalciferol 10 MCG (400 UNIT) CAPS Take by mouth.    Marland Kitchen  hydroquinone 4 % cream APPLY TO FACE EVERY NIGHT    . lisinopril (ZESTRIL) 20 MG tablet Take by mouth.    Marland Kitchen lisinopril-hydrochlorothiazide (PRINZIDE,ZESTORETIC) 20-12.5 MG tablet Take 1 tablet by mouth daily.  3  . metFORMIN (GLUCOPHAGE-XR) 500 MG 24 hr tablet Take 500 mg by mouth 4 (four) times daily.     . Multiple Vitamin (MULTI-VITAMIN) tablet Take by mouth.    . rosuvastatin (CRESTOR) 40 MG tablet Take 40 mg by mouth daily.  3   No current facility-administered medications for  this visit.    Review of Systems  Constitutional: Negative.  Negative for chills, diaphoresis, fever, malaise/fatigue and weight loss.       Feels "fine".  HENT: Negative.  Negative for congestion and sore throat.   Eyes: Negative.  Negative for blurred vision and double vision.  Respiratory: Negative.  Negative for cough and shortness of breath.   Cardiovascular: Negative.  Negative for chest pain, palpitations and leg swelling.  Gastrointestinal: Negative.  Negative for constipation, diarrhea, heartburn, nausea and vomiting.  Genitourinary: Positive for frequency (from drinking coffee). Negative for urgency.  Musculoskeletal: Negative.  Negative for back pain, falls and myalgias.  Skin: Negative.  Negative for rash.  Neurological: Negative.  Negative for dizziness, sensory change, weakness and headaches.  Endo/Heme/Allergies: Positive for environmental allergies. Does not bruise/bleed easily.  Psychiatric/Behavioral: Negative.  Negative for depression. The patient is not nervous/anxious.   All other systems reviewed and are negative.  Performance status (ECOG): 0 - Asymptomatic  Vitals Blood pressure 130/84, pulse 85, temperature (!) 96.1 F (35.6 C), temperature source Tympanic, resp. rate 16, weight 195 lb 10.5 oz (88.7 kg), SpO2 98 %.   Physical Exam Constitutional:      General: She is not in acute distress.    Appearance: Normal appearance. She is well-developed and well-nourished.     Interventions: Face mask in place.  HENT:     Head: Normocephalic and atraumatic.     Mouth/Throat:     Mouth: Oropharynx is clear and moist and mucous membranes are normal. No oral lesions.  Eyes:     Extraocular Movements: EOM normal.     Conjunctiva/sclera: Conjunctivae normal.     Pupils: Pupils are equal, round, and reactive to light.     Comments: Brown eyes  Cardiovascular:     Rate and Rhythm: Normal rate and regular rhythm.     Heart sounds: Normal heart sounds. No murmur heard.   No friction rub. No gallop.   Pulmonary:     Effort: Pulmonary effort is normal.     Breath sounds: Normal breath sounds. No wheezing, rhonchi or rales.  Abdominal:     General: Bowel sounds are normal.     Palpations: Abdomen is soft. There is splenomegaly. There is no hepatosplenomegaly or mass.     Tenderness: There is no abdominal tenderness. There is no CVA tenderness.  Musculoskeletal:        General: No tenderness or edema. Normal range of motion.     Cervical back: Normal range of motion and neck supple.  Lymphadenopathy:     Cervical: No cervical adenopathy.     Upper Body:  No axillary adenopathy present.    Lower Body: No right inguinal adenopathy. No left inguinal adenopathy.  Skin:    General: Skin is warm, dry and intact.     Findings: No bruising, erythema, lesion or rash.  Neurological:     Mental Status: She is alert and oriented to person,  place, and time.  Psychiatric:        Mood and Affect: Mood and affect normal.        Behavior: Behavior normal.        Thought Content: Thought content normal.        Judgment: Judgment normal.    No visits with results within 3 Day(s) from this visit.  Latest known visit with results is:  Appointment on 01/19/2019  Component Date Value Ref Range Status  . Kappa free light chain 01/19/2019 26.9* 3.3 - 19.4 mg/L Final  . Lamda free light chains 01/19/2019 24.5  5.7 - 26.3 mg/L Final  . Kappa, lamda light chain ratio 01/19/2019 1.10  0.26 - 1.65 Final   Comment: (NOTE) Performed At: [facility] [place], [place] [id number] Rush Farmer MD [id number]   . IgG (Immunoglobin G), Serum 01/19/2019 785  700 - 1,600 mg/dL Final  . IgA 01/19/2019 87  64 - 422 mg/dL Final  . IgM (Immunoglobulin M), Srm 01/19/2019 296* 26 - 217 mg/dL Final  . Total Protein ELP 01/19/2019 6.3  6.0 - 8.5 g/dL Corrected  . Albumin SerPl Elph-Mcnc 01/19/2019 3.9  2.9 - 4.4 g/dL Corrected  . Alpha 1 01/19/2019 0.3  0.0 -  0.4 g/dL Corrected  . Alpha2 Glob SerPl Elph-Mcnc 01/19/2019 0.6  0.4 - 1.0 g/dL Corrected  . B-Globulin SerPl Elph-Mcnc 01/19/2019 0.7  0.7 - 1.3 g/dL Corrected  . Gamma Glob SerPl Elph-Mcnc 01/19/2019 0.8  0.4 - 1.8 g/dL Corrected  . M Protein SerPl Elph-Mcnc 01/19/2019 0.3* Not Observed g/dL Corrected  . Globulin, Total 01/19/2019 2.4  2.2 - 3.9 g/dL Corrected  . Albumin/Glob SerPl 01/19/2019 1.7  0.7 - 1.7 Corrected  . IFE 1 01/19/2019 Comment   Corrected   Comment: (NOTE) Immunofixation shows IgM monoclonal protein with kappa light chain specificity.   . Please Note 01/19/2019 Comment   Corrected   Comment: (NOTE) Protein electrophoresis scan will follow via computer, mail, or courier delivery. Performed At: [facility] [place], [place] [id number] Rush Farmer MD [id number]   . Sodium 01/19/2019 137  135 - 145 mmol/L Final  . Potassium 01/19/2019 4.2  3.5 - 5.1 mmol/L Final  . Chloride 01/19/2019 106  98 - 111 mmol/L Final  . CO2 01/19/2019 23  22 - 32 mmol/L Final  . Glucose, Bld 01/19/2019 139* 70 - 99 mg/dL Final  . BUN 01/19/2019 13  8 - 23 mg/dL Final  . Creatinine, Ser 01/19/2019 0.75  0.44 - 1.00 mg/dL Final  . Calcium 01/19/2019 9.2  8.9 - 10.3 mg/dL Final  . Total Protein 01/19/2019 7.2  6.5 - 8.1 g/dL Final  . Albumin 01/19/2019 4.2  3.5 - 5.0 g/dL Final  . AST 01/19/2019 17  15 - 41 U/L Final  . ALT 01/19/2019 15  0 - 44 U/L Final  . Alkaline Phosphatase 01/19/2019 62  38 - 126 U/L Final  . Total Bilirubin 01/19/2019 0.8  0.3 - 1.2 mg/dL Final  . GFR calc non Af Amer 01/19/2019 >60  >60 mL/min Final  . GFR calc Af Amer 01/19/2019 >60  >60 mL/min Final  . Anion gap 01/19/2019 8  5 - 15 Final   Performed at [facility], [address], [place], [place] [address]  . WBC 01/19/2019 4.8  4.0 - 10.5 K/uL Final  . RBC 01/19/2019 4.21  3.87 - 5.11 MIL/uL Final  . Hemoglobin 01/19/2019 11.5* 12.0 - 15.0 g/dL  Final  . HCT  01/19/2019 34.5* 36.0 - 46.0 % Final  . MCV 01/19/2019 81.9  80.0 - 100.0 fL Final  . MCH 01/19/2019 27.3  26.0 - 34.0 pg Final  . MCHC 01/19/2019 33.3  30.0 - 36.0 g/dL Final  . RDW 01/19/2019 14.9  11.5 - 15.5 % Final  . Platelets 01/19/2019 150  150 - 400 K/uL Final  . nRBC 01/19/2019 0.0  0.0 - 0.2 % Final  . Neutrophils Relative % 01/19/2019 57  % Final  . Neutro Abs 01/19/2019 2.7  1.7 - 7.7 K/uL Final  . Lymphocytes Relative 01/19/2019 34  % Final  . Lymphs Abs 01/19/2019 1.6  0.7 - 4.0 K/uL Final  . Monocytes Relative 01/19/2019 7  % Final  . Monocytes Absolute 01/19/2019 0.3  0.1 - 1.0 K/uL Final  . Eosinophils Relative 01/19/2019 2  % Final  . Eosinophils Absolute 01/19/2019 0.1  0.0 - 0.5 K/uL Final  . Basophils Relative 01/19/2019 0  % Final  . Basophils Absolute 01/19/2019 0.0  0.0 - 0.1 K/uL Final  . Immature Granulocytes 01/19/2019 0  % Final  . Abs Immature Granulocytes 01/19/2019 0.01  0.00 - 0.07 K/uL Final   Performed at [facility], [address] [place], [place] [address]       Assessment:  CORA BRIERLEY is a 79 y.o. female with an IgM monoclonal gammopathy of unknown significance.  SPEP has been followed (gm/dL):  0.4 on 07/20/2018 and 0.3 on 01/19/2019.  IgM has been followed (26-217):  303 on 07/20/2018 and 296 on 01/19/2019.    She has a mild normocytic anemia.  Hemoglobin is 11.5.  She has a 33.75 pack year smoking history. Low dose chest CT on 04/17/2020 revealed lung-RADS 2 S.  She has multiple small pulmonary nodules throughout the lungs bilaterally (largest3. 7 mm  in the right middle lobe).  She has splenomegaly (15.2 x 6.9 cm).  The spleen was incompletely imaged.  She has multiple thyroid nodules incidentally found on a carotid ultrasound.  Ultrasound on 05/01/2020 revealed no suspicious nodules on the right side and an intermediate suspicious 2.8 cm left-sided nodule.  Fine-needle aspirate on 05/01/2020 was negative for  malignancy.  There was abundant colloid consistent with a benign colloid nodule.   Symptomatically, she feels "fine".  She denies any B symptoms.  Exam reveals splenomegaly.  Plan: 1.   Labs today: CBC with diff, CMP, SPEP, FLCA, B12, folate, ferritin, iron studies. 2.   IgM monoclonal gammopathy of unknown significance  Symptomatically, she feels fine.  She denies any B symptoms.  Exam reveals splenomegaly suggestive of a posble lymphoproliferative disorder such as Waldenstrm's macroglobulinemia/lymphoplasmacytic lymphoma.  Discuss risk of transformation to a lymphoproliferative disorder of 1%/year.   Discuss IgM multiple myeloma (extremely rare; < 1% of myeloma).    CRAB criteria:  hypercalcemia, renal insufficiency, anemia, bone lesions.   Discuss Waldenstrom's macroglobulinemia.    B symptoms, organomegaly, bulky adenopathy, anemia/cytopenias related to disease, hyperviscosity, neuropathy.  IgM level was low at last check in 01/2019.  Discuss a new evaluation and consideration CT scans. 3.   Mild normocytic anemia  Hematocrit 35.0.  Hemoglobin 11.8.  MCV 79.4 today   Hematocrit 32.4 - 34.5 and hemoglobin 11.0 - 11.5 since 07/2018.  Patient notes colonoscopy at [facility] in 04/2017.  She was scheduled for colonoscopy in 2 years secondary to polyps.   She has not yet arranged for colonoscopy secondary to COVID-19. 4.   Splenomegaly  Exam reveals splenomegaly today.  Discuss importance of CT scans 5.   Schedule abdomen and pelvis CT on 05/24/2020. 6.   RTC next week for MD assessment, review of work-up and discussion regarding direction of therapy.  I discussed the assessment and treatment plan with the patient.  The patient was provided an opportunity to ask questions and all were answered.  The patient agreed with the plan and demonstrated an understanding of the instructions.  The patient was advised to call back if the symptoms worsen or if the condition fails to improve as  anticipated.  I provided 14 minutes (11:26 AM - 11:40 AM) of face-to-face time during this this encounter and > 50% was spent counseling as documented under my assessment and plan. An additional 10 minutes were spent reviewing her chart (Epic and Care Everywhere) including notes, labs, and imaging studies.    Lequita Asal, MD, PhD    05/21/2020, 11:40 AM  I, Jacqualyn Posey, am acting as Education administrator for [employer]. Mike Gip, MD, PhD.  I, Floy Angert C. Mike Gip, MD, have reviewed the above documentation for accuracy and completeness, and I agree with the above.

## 2020-05-21 ENCOUNTER — Inpatient Hospital Stay: Payer: Medicare Other

## 2020-05-21 ENCOUNTER — Other Ambulatory Visit: Payer: Self-pay

## 2020-05-21 ENCOUNTER — Inpatient Hospital Stay: Payer: Medicare Other | Attending: Hematology and Oncology | Admitting: Hematology and Oncology

## 2020-05-21 ENCOUNTER — Encounter: Payer: Self-pay | Admitting: Hematology and Oncology

## 2020-05-21 VITALS — BP 130/84 | HR 85 | Temp 96.1°F | Resp 16 | Wt 195.7 lb

## 2020-05-21 DIAGNOSIS — E119 Type 2 diabetes mellitus without complications: Secondary | ICD-10-CM | POA: Insufficient documentation

## 2020-05-21 DIAGNOSIS — D472 Monoclonal gammopathy: Secondary | ICD-10-CM | POA: Insufficient documentation

## 2020-05-21 DIAGNOSIS — I10 Essential (primary) hypertension: Secondary | ICD-10-CM | POA: Insufficient documentation

## 2020-05-21 DIAGNOSIS — E7800 Pure hypercholesterolemia, unspecified: Secondary | ICD-10-CM | POA: Diagnosis not present

## 2020-05-21 DIAGNOSIS — Z7984 Long term (current) use of oral hypoglycemic drugs: Secondary | ICD-10-CM | POA: Diagnosis not present

## 2020-05-21 DIAGNOSIS — R161 Splenomegaly, not elsewhere classified: Secondary | ICD-10-CM | POA: Diagnosis not present

## 2020-05-21 DIAGNOSIS — D649 Anemia, unspecified: Secondary | ICD-10-CM | POA: Insufficient documentation

## 2020-05-21 DIAGNOSIS — Z87891 Personal history of nicotine dependence: Secondary | ICD-10-CM | POA: Insufficient documentation

## 2020-05-21 DIAGNOSIS — Z79899 Other long term (current) drug therapy: Secondary | ICD-10-CM | POA: Diagnosis not present

## 2020-05-21 DIAGNOSIS — Z90710 Acquired absence of both cervix and uterus: Secondary | ICD-10-CM | POA: Insufficient documentation

## 2020-05-21 DIAGNOSIS — Z7982 Long term (current) use of aspirin: Secondary | ICD-10-CM | POA: Diagnosis not present

## 2020-05-21 LAB — CBC WITH DIFFERENTIAL/PLATELET
Abs Immature Granulocytes: 0.01 10*3/uL (ref 0.00–0.07)
Basophils Absolute: 0 10*3/uL (ref 0.0–0.1)
Basophils Relative: 1 %
Eosinophils Absolute: 0.1 10*3/uL (ref 0.0–0.5)
Eosinophils Relative: 1 %
HCT: 35 % — ABNORMAL LOW (ref 36.0–46.0)
Hemoglobin: 11.8 g/dL — ABNORMAL LOW (ref 12.0–15.0)
Immature Granulocytes: 0 %
Lymphocytes Relative: 32 %
Lymphs Abs: 1.9 10*3/uL (ref 0.7–4.0)
MCH: 26.8 pg (ref 26.0–34.0)
MCHC: 33.7 g/dL (ref 30.0–36.0)
MCV: 79.4 fL — ABNORMAL LOW (ref 80.0–100.0)
Monocytes Absolute: 0.5 10*3/uL (ref 0.1–1.0)
Monocytes Relative: 8 %
Neutro Abs: 3.4 10*3/uL (ref 1.7–7.7)
Neutrophils Relative %: 58 %
Platelets: 145 10*3/uL — ABNORMAL LOW (ref 150–400)
RBC: 4.41 MIL/uL (ref 3.87–5.11)
RDW: 15.6 % — ABNORMAL HIGH (ref 11.5–15.5)
WBC: 5.8 10*3/uL (ref 4.0–10.5)
nRBC: 0 % (ref 0.0–0.2)

## 2020-05-21 LAB — COMPREHENSIVE METABOLIC PANEL
ALT: 15 U/L (ref 0–44)
AST: 18 U/L (ref 15–41)
Albumin: 4.5 g/dL (ref 3.5–5.0)
Alkaline Phosphatase: 57 U/L (ref 38–126)
Anion gap: 8 (ref 5–15)
BUN: 19 mg/dL (ref 8–23)
CO2: 23 mmol/L (ref 22–32)
Calcium: 9.5 mg/dL (ref 8.9–10.3)
Chloride: 102 mmol/L (ref 98–111)
Creatinine, Ser: 0.84 mg/dL (ref 0.44–1.00)
GFR calc Af Amer: >60 mL/min (ref >60)
GFR calc non Af Amer: >60 mL/min (ref >60)
Glucose, Bld: 152 mg/dL — ABNORMAL HIGH (ref 70–99)
Potassium: 4.8 mmol/L (ref 3.5–5.1)
Sodium: 133 mmol/L — ABNORMAL LOW (ref 135–145)
Total Bilirubin: 0.8 mg/dL (ref 0.3–1.2)
Total Protein: 7.9 g/dL (ref 6.5–8.1)

## 2020-05-21 LAB — LACTATE DEHYDROGENASE: LDH: 198 U/L — ABNORMAL HIGH (ref 98–192)

## 2020-05-21 LAB — IRON AND TIBC
Iron: 71 ug/dL (ref 28–170)
Saturation Ratios: 19 % (ref 10.4–31.8)
TIBC: 378 ug/dL (ref 250–450)
UIBC: 307 ug/dL

## 2020-05-21 LAB — URIC ACID: Uric Acid, Serum: 3.5 mg/dL (ref 2.5–7.1)

## 2020-05-21 LAB — FERRITIN: Ferritin: 61 ng/mL (ref 11–307)

## 2020-05-21 NOTE — Progress Notes (Signed)
Pt here for follow up. No new concerns voiced.   

## 2020-05-23 LAB — BETA 2 MICROGLOBULIN, SERUM: Beta-2 Microglobulin: 2.8 mg/L — ABNORMAL HIGH (ref 0.6–2.4)

## 2020-05-24 LAB — MULTIPLE MYELOMA PANEL, SERUM
Albumin SerPl Elph-Mcnc: 4.3 g/dL (ref 2.9–4.4)
Albumin/Glob SerPl: 1.6 (ref 0.7–1.7)
Alpha 1: 0.2 g/dL (ref 0.0–0.4)
Alpha2 Glob SerPl Elph-Mcnc: 0.6 g/dL (ref 0.4–1.0)
B-Globulin SerPl Elph-Mcnc: 0.9 g/dL (ref 0.7–1.3)
Gamma Glob SerPl Elph-Mcnc: 1 g/dL (ref 0.4–1.8)
Globulin, Total: 2.7 g/dL (ref 2.2–3.9)
IgA: 101 mg/dL (ref 64–422)
IgG (Immunoglobin G), Serum: 899 mg/dL (ref 586–1602)
IgM (Immunoglobulin M), Srm: 324 mg/dL — ABNORMAL HIGH (ref 26–217)
M Protein SerPl Elph-Mcnc: 0.4 g/dL — ABNORMAL HIGH
Total Protein ELP: 7 g/dL (ref 6.0–8.5)

## 2020-05-25 ENCOUNTER — Ambulatory Visit
Admission: RE | Admit: 2020-05-25 | Discharge: 2020-05-25 | Disposition: A | Discharge status: Home / Self Care | Payer: Medicare Other | Source: Ambulatory Visit | Attending: Hematology and Oncology | Admitting: Hematology and Oncology

## 2020-05-25 ENCOUNTER — Other Ambulatory Visit: Payer: Self-pay

## 2020-05-25 DIAGNOSIS — D472 Monoclonal gammopathy: Secondary | ICD-10-CM | POA: Diagnosis not present

## 2020-05-25 DIAGNOSIS — R161 Splenomegaly, not elsewhere classified: Secondary | ICD-10-CM | POA: Insufficient documentation

## 2020-05-25 MED ORDER — IOHEXOL 300 MG/ML  SOLN
100.0000 mL | Freq: Once | INTRAMUSCULAR | Status: AC | PRN
  Administered 2020-05-25: 100 mL via INTRAVENOUS

## 2020-05-30 NOTE — Progress Notes (Signed)
[facility]  [address] [place], [place] [address] Phone: [phone]  Fax: [phone]   Clinic Day:  06/01/2020  Referring physician: Leim Fabry, MD  Chief Complaint: Dawn Mccann is a 79 y.o. female with with an IgM monoclonal gammopathy of unknown significance who is seen for review of interval CT scan and discussion regarding direction of therapy.  HPI: The patient was last seen in the hematology clinic on 05/21/2020. At that time, she felt well.  She denied any B symptoms.  She denied any bruising or bleeding, infections or bone pain.  Work up included a hematocrit 35.0, hemoglobin 11.8, MCV 79.4, platelets 145,000, WBC 5,800. Ferritin was 61 with and iron saturation of 19% and a TIBC of 378. Sodium was 133.  Beta-2 microglobulin was 2.8 (0.6-2.4). Uric acid 3.5. LDH was 198 (98-192). IgM was 324 (26-217).  M spike was 0.4 gm/dL.   Abdomen and pelvic CT on 05/25/2020 revealed moderate splenomegaly (17 cm). There was no evidence of soft tissue masses or lymphadenopathy.  During the interim, she felt fine. She reports no symptoms. She had a bone marrow when she was 79 years old. I educated the patient on the procedure for bone marrow biopsies. She notes a bad reaction due to the oral contrast for the CT scan on 05/25/2020. She had massive diarrhea and felt so bad she slept for 12 hours. 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 She would like surgery to correct carpal tunnel in left hand.    Past Medical History:  Diagnosis Date  . Diabetes mellitus without complication (HCC)   . High cholesterol   . Hypertension     Past Surgical History:  Procedure Laterality Date  . ABDOMINAL HYSTERECTOMY    . CHOLECYSTECTOMY    . ORTHOPEDIC SURGERY      No family history on file.  Social History:  reports that she quit smoking about 10 years ago. Her smoking use included cigarettes. She has a 33.75 pack-year smoking history. She has never  used smokeless tobacco. She reports that she does not drink alcohol and does not use drugs. She does not drink alcohol. Former smoker; quit 2011. Patient is a Insurance claims handler brat".  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  She lived in [place] as a child.  She lives in [place]. The patient is accompanied by her oldest daughter Dawn Mccann via iPad today.  Allergies:  Allergies  Allergen Reactions  . Amoxicillin Swelling    And HIVES  . Codeine Other (See Comments)    Other Reaction: Mental status changes    Current Medications: Current Outpatient Medications  Medication Sig Dispense Refill  . aspirin 81 MG EC tablet Take by mouth.    . Cholecalciferol 10 MCG (400 UNIT) CAPS Take by mouth.    . hydroquinone 4 % cream APPLY TO FACE EVERY NIGHT    . lisinopril (ZESTRIL) 20 MG tablet Take by mouth.    Marland Kitchen lisinopril-hydrochlorothiazide (PRINZIDE,ZESTORETIC) 20-12.5 MG tablet Take 1 tablet by mouth daily.  3  . Multiple Vitamin (MULTI-VITAMIN) tablet Take by mouth.    . rosuvastatin (CRESTOR) 40 MG tablet Take 40 mg by mouth daily.  3  . metFORMIN (GLUCOPHAGE-XR) 500 MG 24 hr tablet Take 500 mg by mouth 4 (four) times daily.      No current facility-administered medications for this visit.    Review of Systems  Constitutional: Positive for weight loss (1 lb).  Negative for chills, diaphoresis, fever and malaise/fatigue.       Feels fine.  HENT: Negative for congestion and sore throat.   Eyes: Negative for blurred vision and double vision.  Respiratory: Negative for cough and shortness of breath.   Cardiovascular: Negative for chest pain, palpitations and leg swelling.  Gastrointestinal: Negative for constipation, diarrhea, heartburn, nausea and vomiting.  Genitourinary: Negative for frequency and urgency.  Musculoskeletal: Negative for back pain, falls and myalgias.  Skin: Negative for rash.  Neurological: Negative for dizziness, sensory change,  weakness and headaches.  Endo/Heme/Allergies: Negative for environmental allergies. Does not bruise/bleed easily.  Psychiatric/Behavioral: Negative for depression. The patient is not nervous/anxious.   All other systems reviewed and are negative.  Performance status (ECOG): 0  Vitals Blood pressure 140/60, pulse 70, temperature (!) 97.1 F (36.2 C), temperature source Tympanic, weight 194 lb 14.2 oz (88.4 kg), SpO2 100 %.   Physical Exam Constitutional:      General: She is not in acute distress.    Appearance: Normal appearance. She is well-developed.     Interventions: Face mask in place.  HENT:     Head: Normocephalic and atraumatic.     Comments: Shoulder length hair. Eyes:     Conjunctiva/sclera: Conjunctivae normal.     Comments: Brown eyes  Neurological:     Mental Status: She is alert and oriented to person, place, and time.  Psychiatric:        Behavior: Behavior normal.        Thought Content: Thought content normal.        Judgment: Judgment normal.    No visits with results within 3 Day(s) from this visit.  Latest known visit with results is:  Office Visit on 05/21/2020  Component Date Value Ref Range Status  . Beta-2 Microglobulin 05/21/2020 2.8* 0.6 - 2.4 mg/L Final   Comment: (NOTE) Siemens Immulite 2000 Immunochemiluminometric assay (ICMA) Values obtained with different assay methods or kits cannot be used interchangeably. Results cannot be interpreted as absolute evidence of the presence or absence of malignant disease. Performed At: [facility] [address] [place], [place] [id number] Jolene Schimke MD RT:[id number]   . Iron 05/21/2020 71  28 - 170 ug/dL Final  . TIBC 13/56/0906 378  250 - 450 ug/dL Final  . Saturation Ratios 05/21/2020 19  10.4 - 31.8 % Final  . UIBC 05/21/2020 307  ug/dL Final   Performed at [facility] [address] [place], [place] [address]  . Ferritin 05/21/2020 61  11 - 307 ng/mL Final   Performed at  [facility], [address] [place], [place] [address]  . IgG (Immunoglobin G), Serum 05/21/2020 899  586 - 1,602 mg/dL Final  . IgA 92/33/4162 101  64 - 422 mg/dL Final  . IgM (Immunoglobulin M), Srm 05/21/2020 324* 26 - 217 mg/dL Final  . Total Protein ELP 05/21/2020 7.0  6.0 - 8.5 g/dL Corrected  . Albumin SerPl Elph-Mcnc 05/21/2020 4.3  2.9 - 4.4 g/dL Corrected  . Alpha 1 05/21/2020 0.2  0.0 - 0.4 g/dL Corrected  . Alpha2 Glob SerPl Elph-Mcnc 05/21/2020 0.6  0.4 - 1.0 g/dL Corrected  . B-Globulin SerPl Elph-Mcnc 05/21/2020 0.9  0.7 - 1.3 g/dL Corrected  . Gamma Glob SerPl Elph-Mcnc 05/21/2020 1.0  0.4 - 1.8 g/dL Corrected  . M Protein SerPl Elph-Mcnc 05/21/2020 0.4* Not Observed g/dL Corrected  . Globulin, Total 05/21/2020 2.7  2.2 - 3.9 g/dL Corrected  . Albumin/Glob SerPl 05/21/2020 1.6  0.7 - 1.7 Corrected  . IFE 1 05/21/2020 Comment*  Corrected   Comment: (NOTE) Immunofixation shows IgM monoclonal protein with kappa light chain specificity.   . Please Note 05/21/2020 Comment   Corrected   Comment: (NOTE) Protein electrophoresis scan will follow via computer, mail, or courier delivery. Performed At: [facility] [place], [place] [id number] Rush Farmer MD VV:[id number]   . Uric Acid, Serum 05/21/2020 3.5  2.5 - 7.1 mg/dL Final   Performed at [facility], [address], [place], [place] [address]  . LDH 05/21/2020 198* 98 - 192 U/L Final   Performed at [facility], [address] [place], [place] [address]  . Sodium 05/21/2020 133* 135 - 145 mmol/L Final  . Potassium 05/21/2020 4.8  3.5 - 5.1 mmol/L Final  . Chloride 05/21/2020 102  98 - 111 mmol/L Final  . CO2 05/21/2020 23  22 - 32 mmol/L Final  . Glucose, Bld 05/21/2020 152* 70 - 99 mg/dL Final   Glucose reference range applies only to samples taken after fasting for at least 8 hours.  . BUN 05/21/2020 19  8 - 23 mg/dL Final  . Creatinine, Ser 05/21/2020  0.84  0.44 - 1.00 mg/dL Final  . Calcium 05/21/2020 9.5  8.9 - 10.3 mg/dL Final  . Total Protein 05/21/2020 7.9  6.5 - 8.1 g/dL Final  . Albumin 05/21/2020 4.5  3.5 - 5.0 g/dL Final  . AST 05/21/2020 18  15 - 41 U/L Final  . ALT 05/21/2020 15  0 - 44 U/L Final  . Alkaline Phosphatase 05/21/2020 57  38 - 126 U/L Final  . Total Bilirubin 05/21/2020 0.8  0.3 - 1.2 mg/dL Final  . GFR calc non Af Amer 05/21/2020 >60  >60 mL/min Final  . GFR calc Af Amer 05/21/2020 >60  >60 mL/min Final  . Anion gap 05/21/2020 8  5 - 15 Final   Performed at [facility], [address], [place], [place] [address]  . WBC 05/21/2020 5.8  4.0 - 10.5 K/uL Final  . RBC 05/21/2020 4.41  3.87 - 5.11 MIL/uL Final  . Hemoglobin 05/21/2020 11.8* 12.0 - 15.0 g/dL Final  . HCT 05/21/2020 35.0* 36 - 46 % Final  . MCV 05/21/2020 79.4* 80.0 - 100.0 fL Final  . MCH 05/21/2020 26.8  26.0 - 34.0 pg Final  . MCHC 05/21/2020 33.7  30.0 - 36.0 g/dL Final  . RDW 05/21/2020 15.6* 11.5 - 15.5 % Final  . Platelets 05/21/2020 145* 150 - 400 K/uL Final  . nRBC 05/21/2020 0.0  0.0 - 0.2 % Final  . Neutrophils Relative % 05/21/2020 58  % Final  . Neutro Abs 05/21/2020 3.4  1.7 - 7.7 K/uL Final  . Lymphocytes Relative 05/21/2020 32  % Final  . Lymphs Abs 05/21/2020 1.9  0.7 - 4.0 K/uL Final  . Monocytes Relative 05/21/2020 8  % Final  . Monocytes Absolute 05/21/2020 0.5  0 - 1 K/uL Final  . Eosinophils Relative 05/21/2020 1  % Final  . Eosinophils Absolute 05/21/2020 0.1  0 - 0 K/uL Final  . Basophils Relative 05/21/2020 1  % Final  . Basophils Absolute 05/21/2020 0.0  0 - 0 K/uL Final  . Immature Granulocytes 05/21/2020 0  % Final  . Abs Immature Granulocytes 05/21/2020 0.01  0.00 - 0.07 K/uL Final   Performed at [facility], [address] [place], [place] [address]  Assessment:  Dawn Mccann is a 79 y.o. female with an IgM monoclonal gammopathy of unknown significance.  Work up on  05/21/2020 revealed a hematocrit 35.0, hemoglobin 11.8, MCV 79.4, platelets 145,000, WBC 5,800.  Normal studies included:  Ferritin (61), iron saturation (19%), uric acid.  LDH was 198 (98-192). Beta-2 microglobulin was 2.8 (0.6-2.4).   Abdomen and pelvic CT on 05/25/2020 revealed moderate splenomegaly (17 cm). There was no evidence of soft tissue masses or lymphadenopathy.  SPEP has been followed (gm/dL):  0.4 on 07/20/2018, 0.3 on 01/19/2019, and 0.4 on 05/21/2020.  IgM has been followed (26-217):  303 on 07/20/2018, 296 on 01/19/2019, and 324 on 05/21/2020.    She has a mild normocytic anemia.  Hemoglobin was 11.5 on 01/19/2019.  She has a 33.75 pack year smoking history. Low dose chest CT on 04/17/2020 revealed lung-RADS 2 S.  She has multiple small pulmonary nodules throughout the lungs bilaterally (largest3. 7 mm  in the right middle lobe).  She has splenomegaly (15.2 x 6.9 cm).  The spleen was incompletely imaged.  She has multiple thyroid nodules incidentally found on a carotid ultrasound.  Ultrasound on 05/01/2020 revealed no suspicious nodules on the right side and an intermediate suspicious 2.8 cm left-sided nodule.  Fine-needle aspirate on 05/01/2020 was negative for malignancy.  There was abundant colloid consistent with a benign colloid nodule.   Symptomatically, she feels fine.    Plan: 1.   IgM monoclonal gammopathy of unknown significance  Symptomatically, she denies any B symptoms.  Exam reveals splenomegaly.  Labs reveal a hematocrit of 35, hemoglobin 11.8, MCV 79.4, platelets are 45,000, WBC 4500.   M-spike is stable at 0.4 gm/dL.  Discuss concern for Waldenstrom's macroglobulinemia/lymphoplasmacytic lymphoma based on splenomegaly.   Information provided.  Discuss risk of transformation to a lymphoproliferative disorder of 1%/year.   Review IgM multiple myeloma (extremely rare; < 1% of myeloma).    CRAB criteria:  hypercalcemia, renal insufficiency, anemia, bone  lesions.   Review briefly Waldenstrom's macroglobulinemia.    organomegaly, bulky adenopathy, anemia/cytopenias related to disease, hyperviscosity, neuropathy.  Consider bone marrow aspirate and biopsy.  Discuss indications for treatment. 2.   Mild normocytic anemia  Hematocrit 32.4 - 34.5 and hemoglobin 11.0 - 11.5 since 07/2018.  Patient notes colonoscopy at [facility] in 04/2017.   She is scheduled for follow-up colonoscopy in 2 years secondary to polyps. 3.   Splenomegaly  Etiology c/w Waldenstrom's macroglobulinemia. 4.   RTC in 3-4 months for MD assessment and labs (CBC with diff, CMP, LDH, uric acid, SPEP).   I discussed the assessment and treatment plan with the patient.  The patient was provided an opportunity to ask questions and all were answered.  The patient agreed with the plan and demonstrated an understanding of the instructions.  The patient was advised to call back if the symptoms worsen or if the condition fails to improve as anticipated.  I provided 20 minutes of face-to-face time during this this encounter and > 50% was spent counseling as documented under my assessment and plan.    Lequita Asal, MD, PhD    06/01/2020, 9:53 AM  I, Selena Batten, am acting as scribe for [employer]. Mike Gip, MD, PhD.  I, Fern Asmar C. Mike Gip, MD, have reviewed the above documentation for accuracy and completeness, and I agree with the above.

## 2020-05-31 NOTE — Progress Notes (Signed)
Patient denied having problems or concerns.

## 2020-06-01 ENCOUNTER — Encounter: Payer: Self-pay | Admitting: Hematology and Oncology

## 2020-06-01 ENCOUNTER — Other Ambulatory Visit: Payer: Self-pay

## 2020-06-01 ENCOUNTER — Inpatient Hospital Stay (HOSPITAL_BASED_OUTPATIENT_CLINIC_OR_DEPARTMENT_OTHER): Payer: Medicare Other | Admitting: Hematology and Oncology

## 2020-06-01 VITALS — BP 140/60 | HR 70 | Temp 97.1°F | Wt 194.9 lb

## 2020-06-01 DIAGNOSIS — R161 Splenomegaly, not elsewhere classified: Secondary | ICD-10-CM | POA: Diagnosis not present

## 2020-06-01 DIAGNOSIS — D649 Anemia, unspecified: Secondary | ICD-10-CM | POA: Diagnosis not present

## 2020-06-01 DIAGNOSIS — D472 Monoclonal gammopathy: Secondary | ICD-10-CM

## 2020-06-01 NOTE — Progress Notes (Signed)
Patient here for oncology follow-up appointment, expresses no complaints or concerns at this time.    

## 2020-09-06 ENCOUNTER — Ambulatory Visit: Payer: Medicare Other | Admitting: Hematology and Oncology

## 2020-09-06 ENCOUNTER — Other Ambulatory Visit: Payer: Medicare Other

## 2021-04-03 ENCOUNTER — Telehealth: Payer: Self-pay | Admitting: *Deleted

## 2021-04-03 NOTE — Telephone Encounter (Signed)
Spoke to patient via telephone re :scheduling her annual lung screening CT Scan. Patient stated that she no longer needs to have this done because she had a whole body PET Scan done at [facility] on 10/18/20, and it showed "no cancer in her body". She requested to be taken off our list and to not be called about this anymore.

## 2021-09-07 IMAGING — CT CT ABD-PELV W/ CM
1 of 3 series · 14 of 32 positions shown, 19 images · IV contrast (APPLIED)
Comparison: None.

CLINICAL DATA: Splenomegaly.  Monoclonal gammopathy.

EXAM:
CT ABDOMEN AND PELVIS WITH CONTRAST
TECHNIQUE: Multidetector CT imaging of the abdomen and pelvis was performed
using the standard protocol following bolus administration of
intravenous contrast.
CONTRAST:  100mL OMNIPAQUE IOHEXOL 300 MG/ML  SOLN

[Series 2: axial st · axial · 0.75mm/px · z∈[-1026,-616]mm · 14 of 94 slices shown, 19 images]
[im 6/94  soft-tissue]
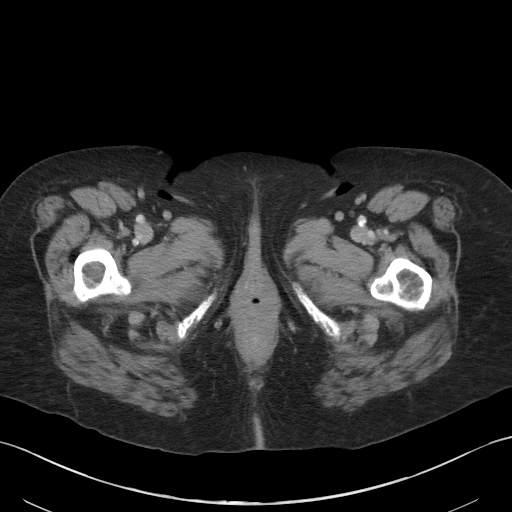
[im 6/94  bone]
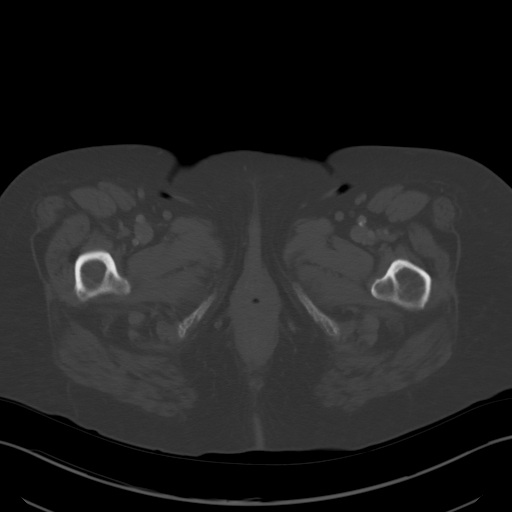
[im 11/94  soft-tissue]
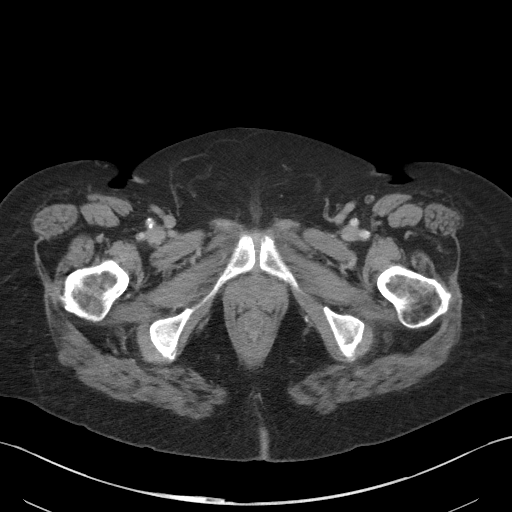
[im 21/94  soft-tissue]
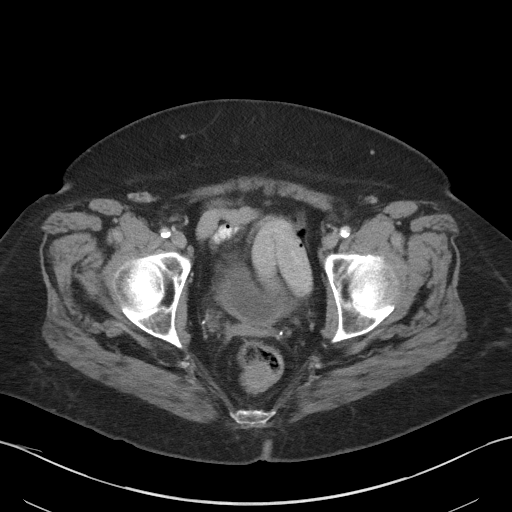
[im 26/94  soft-tissue]
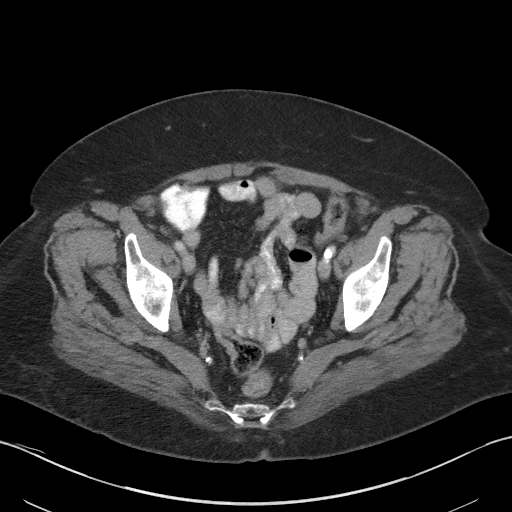
[im 32/94  soft-tissue]
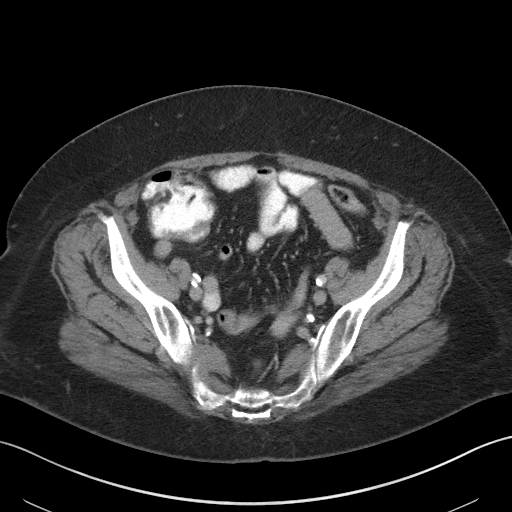
[im 42/94  soft-tissue]
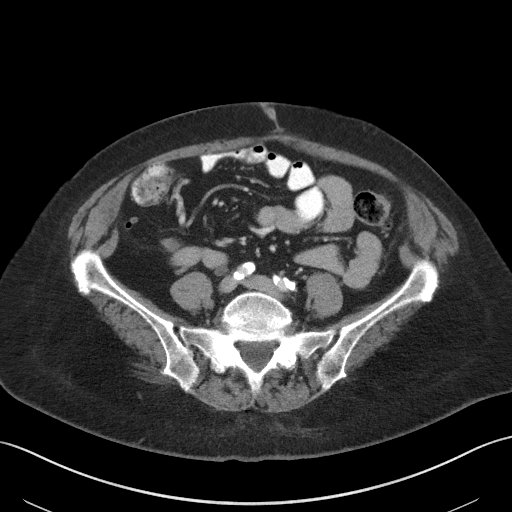
[im 47/94  soft-tissue]
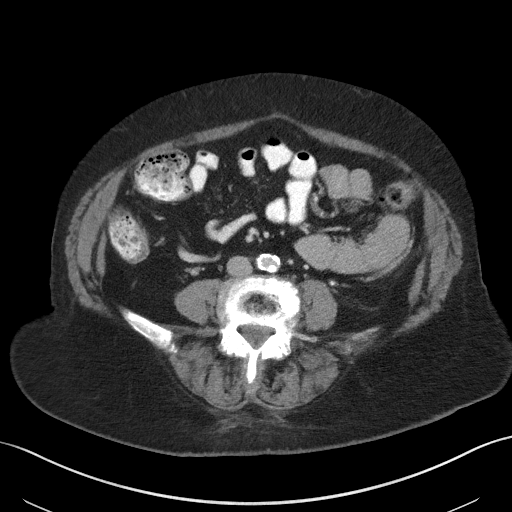
[im 52/94  soft-tissue]
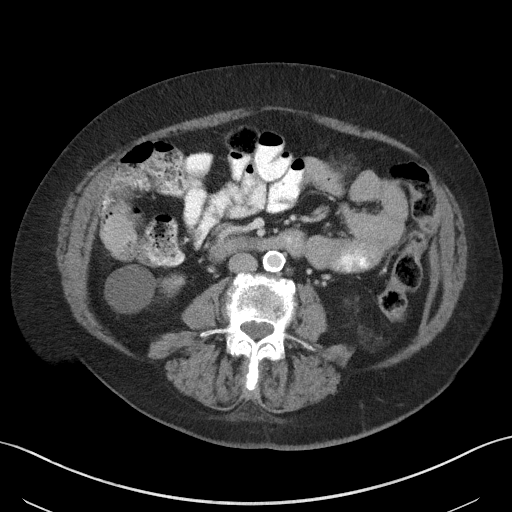
[im 63/94  soft-tissue]
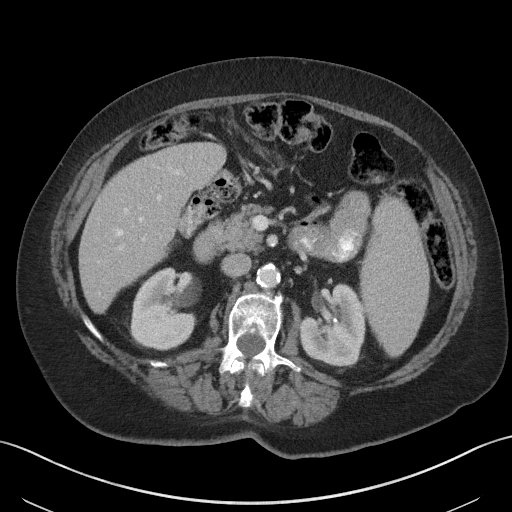
[im 63/94  bone]
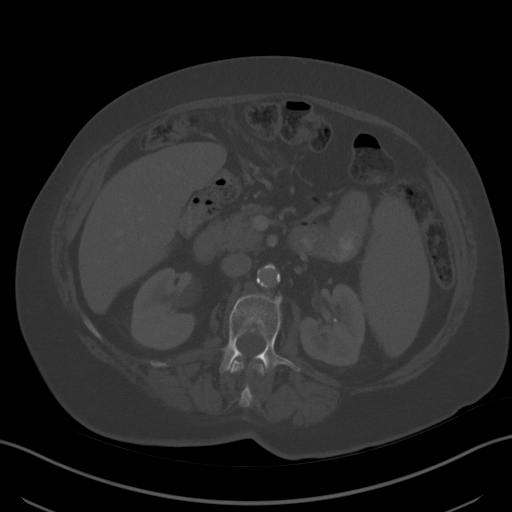
[im 68/94  soft-tissue]
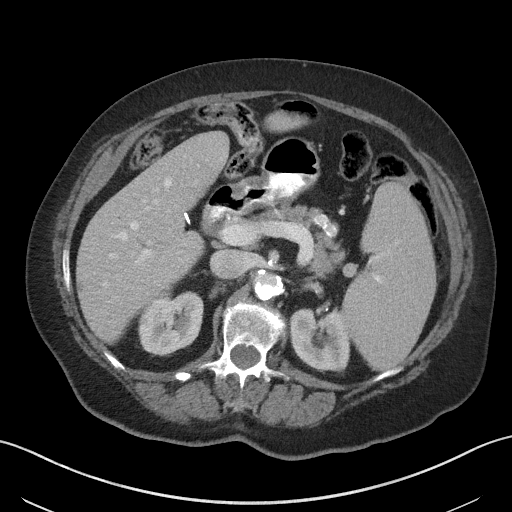
[im 73/94  soft-tissue]
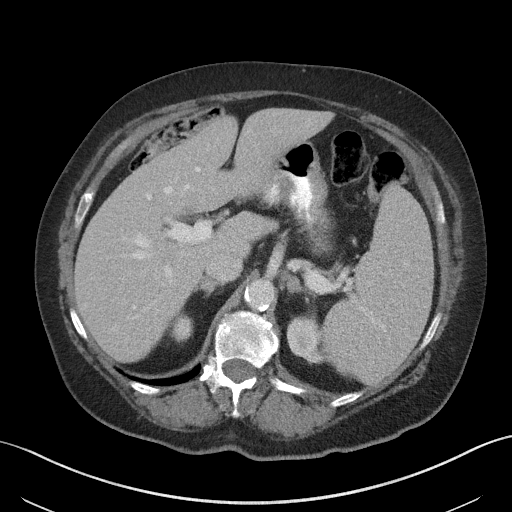
[im 73/94  lung]
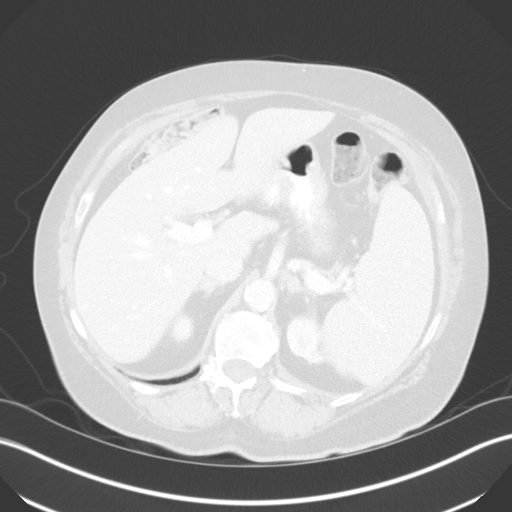
[im 78/94  lung]
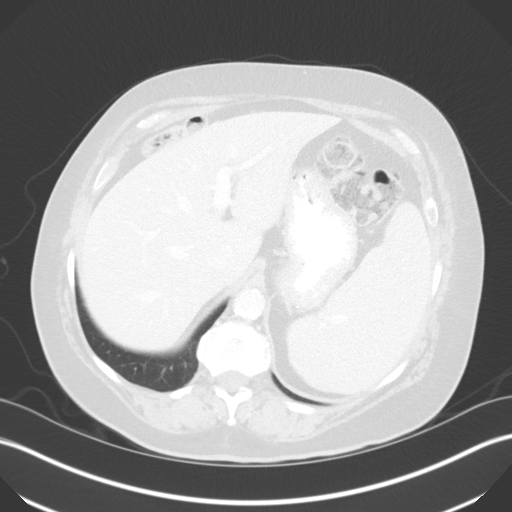
[im 83/94  soft-tissue]
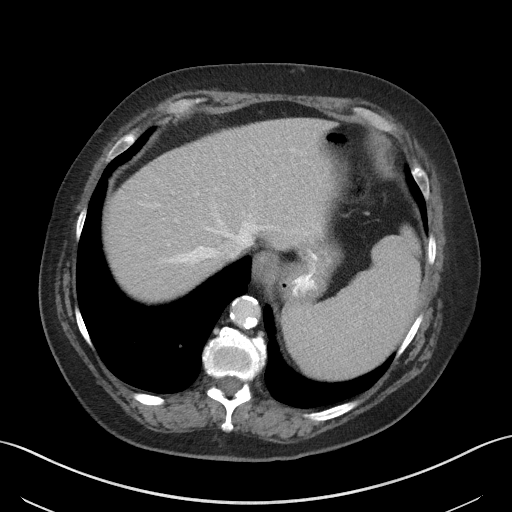
[im 83/94  lung]
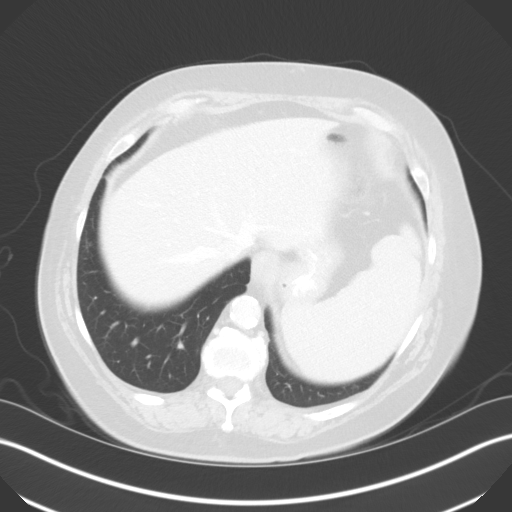
[im 88/94  soft-tissue]
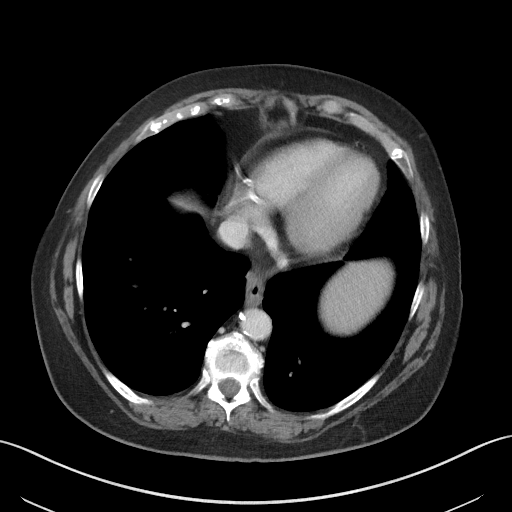
[im 88/94  lung]
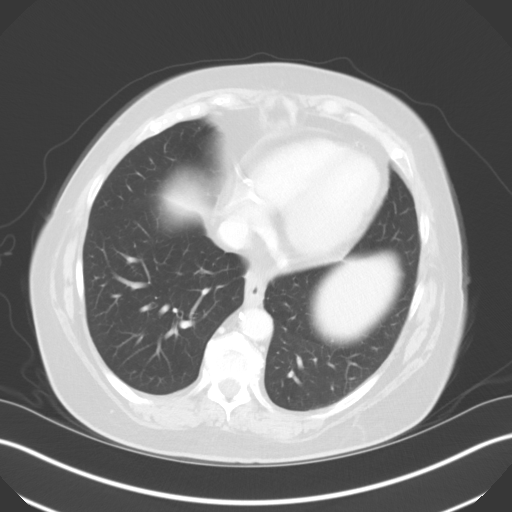

[14 of 32 positions shown; findings below may reference images not displayed]

FINDINGS: Lower Chest: No acute findings.

Hepatobiliary: No hepatic masses identified. A few tiny hepatic
cysts are noted. Prior cholecystectomy. No evidence of biliary
obstruction.

Pancreas:  No mass or inflammatory changes.

Spleen: Moderate splenomegaly is seen with length measuring
approximately 17 cm. No splenic masses identified.

Adrenals/Urinary Tract: A few renal cysts are seen bilaterally. No
masses identified. Vascular calcifications seen in both renal hila,
although tiny renal calculi cannot definitely be excluded. No
evidence of ureteral calculi or hydronephrosis.

Stomach/Bowel: No evidence of obstruction, inflammatory process or
abnormal fluid collections. Normal appendix visualized.

Vascular/Lymphatic: No pathologically enlarged lymph nodes. No
abdominal aortic aneurysm. Aortic atherosclerosis noted.

Reproductive: Prior hysterectomy noted. Adnexal regions are
unremarkable in appearance.

Other:  None.

Musculoskeletal:  No suspicious bone lesions identified.
IMPRESSION: Moderate splenomegaly.

No evidence of soft tissue masses or lymphadenopathy.

Aortic Atherosclerosis ([id number]).

## 2024-12-02 ENCOUNTER — Other Ambulatory Visit: Payer: Self-pay | Admitting: Family Medicine

## 2024-12-02 DIAGNOSIS — Z780 Asymptomatic menopausal state: Secondary | ICD-10-CM

## 2025-01-09 ENCOUNTER — Other Ambulatory Visit (HOSPITAL_COMMUNITY): Payer: Self-pay
